# Patient Record
Sex: Male | Born: 1955 | Race: White | Hispanic: No | Marital: Married | Smoking: Never smoker
Health system: Southern US, Community
[De-identification: ages and names within clinical notes are randomized; demographics above are authoritative.]

## PROBLEM LIST (undated history)

## (undated) DIAGNOSIS — I480 Paroxysmal atrial fibrillation: Secondary | ICD-10-CM

## (undated) DIAGNOSIS — K219 Gastro-esophageal reflux disease without esophagitis: Secondary | ICD-10-CM

## (undated) DIAGNOSIS — E785 Hyperlipidemia, unspecified: Secondary | ICD-10-CM

## (undated) DIAGNOSIS — J45909 Unspecified asthma, uncomplicated: Secondary | ICD-10-CM

## (undated) DIAGNOSIS — J90 Pleural effusion, not elsewhere classified: Secondary | ICD-10-CM

## (undated) DIAGNOSIS — I10 Essential (primary) hypertension: Secondary | ICD-10-CM

## (undated) DIAGNOSIS — I319 Disease of pericardium, unspecified: Secondary | ICD-10-CM

## (undated) DIAGNOSIS — G4730 Sleep apnea, unspecified: Secondary | ICD-10-CM

---

## 2020-02-04 ENCOUNTER — Emergency Department (HOSPITAL_COMMUNITY): Payer: Medicare PPO

## 2020-02-04 ENCOUNTER — Inpatient Hospital Stay (HOSPITAL_COMMUNITY)
Admission: EM | Admit: 2020-02-04 | Discharge: 2020-02-07 | DRG: 281 | Disposition: A | Discharge status: Home / Self Care | Payer: Medicare PPO | Attending: Internal Medicine | Admitting: Internal Medicine

## 2020-02-04 ENCOUNTER — Encounter (HOSPITAL_COMMUNITY): Payer: Self-pay | Admitting: Emergency Medicine

## 2020-02-04 ENCOUNTER — Other Ambulatory Visit: Payer: Self-pay

## 2020-02-04 DIAGNOSIS — I214 Non-ST elevation (NSTEMI) myocardial infarction: Secondary | ICD-10-CM | POA: Diagnosis not present

## 2020-02-04 DIAGNOSIS — Z888 Allergy status to other drugs, medicaments and biological substances status: Secondary | ICD-10-CM

## 2020-02-04 DIAGNOSIS — I25118 Atherosclerotic heart disease of native coronary artery with other forms of angina pectoris: Secondary | ICD-10-CM

## 2020-02-04 DIAGNOSIS — K219 Gastro-esophageal reflux disease without esophagitis: Secondary | ICD-10-CM | POA: Diagnosis present

## 2020-02-04 DIAGNOSIS — G4733 Obstructive sleep apnea (adult) (pediatric): Secondary | ICD-10-CM

## 2020-02-04 DIAGNOSIS — I10 Essential (primary) hypertension: Secondary | ICD-10-CM | POA: Diagnosis present

## 2020-02-04 DIAGNOSIS — F32A Depression, unspecified: Secondary | ICD-10-CM | POA: Diagnosis present

## 2020-02-04 DIAGNOSIS — Z6841 Body Mass Index (BMI) 40.0 and over, adult: Secondary | ICD-10-CM

## 2020-02-04 DIAGNOSIS — J45909 Unspecified asthma, uncomplicated: Secondary | ICD-10-CM | POA: Diagnosis present

## 2020-02-04 DIAGNOSIS — Z87892 Personal history of anaphylaxis: Secondary | ICD-10-CM

## 2020-02-04 DIAGNOSIS — Z7982 Long term (current) use of aspirin: Secondary | ICD-10-CM

## 2020-02-04 DIAGNOSIS — I2510 Atherosclerotic heart disease of native coronary artery without angina pectoris: Secondary | ICD-10-CM | POA: Diagnosis present

## 2020-02-04 DIAGNOSIS — Z79899 Other long term (current) drug therapy: Secondary | ICD-10-CM

## 2020-02-04 DIAGNOSIS — G43909 Migraine, unspecified, not intractable, without status migrainosus: Secondary | ICD-10-CM | POA: Diagnosis present

## 2020-02-04 DIAGNOSIS — Z20822 Contact with and (suspected) exposure to covid-19: Secondary | ICD-10-CM | POA: Diagnosis present

## 2020-02-04 DIAGNOSIS — I480 Paroxysmal atrial fibrillation: Secondary | ICD-10-CM | POA: Diagnosis present

## 2020-02-04 DIAGNOSIS — F419 Anxiety disorder, unspecified: Secondary | ICD-10-CM | POA: Diagnosis present

## 2020-02-04 DIAGNOSIS — M109 Gout, unspecified: Secondary | ICD-10-CM | POA: Diagnosis present

## 2020-02-04 DIAGNOSIS — I4819 Other persistent atrial fibrillation: Secondary | ICD-10-CM | POA: Diagnosis present

## 2020-02-04 DIAGNOSIS — E782 Mixed hyperlipidemia: Secondary | ICD-10-CM | POA: Diagnosis present

## 2020-02-04 DIAGNOSIS — Z885 Allergy status to narcotic agent status: Secondary | ICD-10-CM

## 2020-02-04 DIAGNOSIS — R079 Chest pain, unspecified: Secondary | ICD-10-CM | POA: Diagnosis present

## 2020-02-04 HISTORY — DX: Pleural effusion, not elsewhere classified: J90

## 2020-02-04 HISTORY — DX: Gastro-esophageal reflux disease without esophagitis: K21.9

## 2020-02-04 HISTORY — DX: Unspecified asthma, uncomplicated: J45.909

## 2020-02-04 HISTORY — DX: Sleep apnea, unspecified: G47.30

## 2020-02-04 HISTORY — DX: Hyperlipidemia, unspecified: E78.5

## 2020-02-04 HISTORY — DX: Disease of pericardium, unspecified: I31.9

## 2020-02-04 HISTORY — DX: Paroxysmal atrial fibrillation: I48.0

## 2020-02-04 HISTORY — DX: Essential (primary) hypertension: I10

## 2020-02-04 LAB — CBC
HCT: 50.4 % (ref 39.0–52.0)
Hemoglobin: 17 g/dL (ref 13.0–17.0)
MCH: 32.4 pg (ref 26.0–34.0)
MCHC: 33.7 g/dL (ref 30.0–36.0)
MCV: 96.2 fL (ref 80.0–100.0)
Platelets: 185 10*3/uL (ref 150–400)
RBC: 5.24 MIL/uL (ref 4.22–5.81)
RDW: 12.8 % (ref 11.5–15.5)
WBC: 12.2 10*3/uL — ABNORMAL HIGH (ref 4.0–10.5)
nRBC: 0 % (ref 0.0–0.2)

## 2020-02-04 LAB — BASIC METABOLIC PANEL
Anion gap: 9 (ref 5–15)
BUN: 18 mg/dL (ref 8–23)
CO2: 25 mmol/L (ref 22–32)
Calcium: 9.1 mg/dL (ref 8.9–10.3)
Chloride: 103 mmol/L (ref 98–111)
Creatinine, Ser: 0.85 mg/dL (ref 0.61–1.24)
GFR, Estimated: >60 mL/min (ref >60)
Glucose, Bld: 122 mg/dL — ABNORMAL HIGH (ref 70–99)
Potassium: 4.2 mmol/L (ref 3.5–5.1)
Sodium: 137 mmol/L (ref 135–145)

## 2020-02-04 LAB — TROPONIN I (HIGH SENSITIVITY): Troponin I (High Sensitivity): 247 ng/L (ref <18)

## 2020-02-04 MED ORDER — NITROGLYCERIN 0.4 MG SL SUBL
0.4000 mg | SUBLINGUAL_TABLET | SUBLINGUAL | Status: DC | PRN
  Administered 2020-02-05 (×2): 0.4 mg via SUBLINGUAL
  Filled 2020-02-04: qty 1

## 2020-02-04 MED ORDER — HEPARIN (PORCINE) 25000 UT/250ML-% IV SOLN
1850.0000 [IU]/h | INTRAVENOUS | Status: DC
  Administered 2020-02-05: 1400 [IU]/h via INTRAVENOUS
  Administered 2020-02-05: 12:00:00 1700 [IU]/h via INTRAVENOUS
  Administered 2020-02-06: 01:00:00 1850 [IU]/h via INTRAVENOUS
  Filled 2020-02-04 (×3): qty 250

## 2020-02-04 MED ORDER — DIPHENHYDRAMINE HCL 50 MG/ML IJ SOLN
25.0000 mg | Freq: Once | INTRAMUSCULAR | Status: AC
  Administered 2020-02-04: 25 mg via INTRAVENOUS
  Filled 2020-02-04: qty 1

## 2020-02-04 MED ORDER — ASPIRIN 81 MG PO CHEW
324.0000 mg | CHEWABLE_TABLET | Freq: Once | ORAL | Status: AC
  Administered 2020-02-04: 23:00:00 324 mg via ORAL
  Filled 2020-02-04: qty 4

## 2020-02-04 MED ORDER — HEPARIN BOLUS VIA INFUSION
4000.0000 [IU] | Freq: Once | INTRAVENOUS | Status: AC
  Administered 2020-02-05: 4000 [IU] via INTRAVENOUS
  Filled 2020-02-04: qty 4000

## 2020-02-04 MED ORDER — PROCHLORPERAZINE EDISYLATE 10 MG/2ML IJ SOLN
10.0000 mg | Freq: Once | INTRAMUSCULAR | Status: AC
  Administered 2020-02-04: 10 mg via INTRAVENOUS
  Filled 2020-02-04: qty 2

## 2020-02-04 NOTE — ED Notes (Signed)
Date and time results received: 02/04/20 2035 (use smartphrase ".now" to insert current time)  Test: troponin Critical Value: 247  Name of Provider Notified: Dalene Seltzer, MD   Orders Received? Or Actions Taken?: Actions Taken: Provider notified

## 2020-02-04 NOTE — ED Triage Notes (Signed)
Patient c/o chest pain radiating to neck and back. Reports pain mimics previous symptoms with pericarditis.

## 2020-02-04 NOTE — ED Provider Notes (Addendum)
**Marcus Marcus** Marcus Marcus   CSN: [id number] Arrival date & time: 02/04/20  1927     History Chief Complaint  Patient presents with  . Chest Pain    Marcus Marcus Marcus Marcus. is a 64 y.o. male.  HPI      64yo male with history of atrial fibrillation no longer on eliquis/flecainide as of March 2021 given no known recurrence of atrial fibrillation, asthma, hypertension, hypercholesterolemia, pericardial effusion with pericarditis 2019, presents with concern for chest pain.   Pain located middle of the chest with radiation to the jaw bilaterally and back. Began Sunday AM, head throbbing, felt badly. Mainly just slept, fatigue.  One day last week had chest pain and fatigue, then improved and then Sunday it returned.  Associated dyspnea, can't fill lungs fast enough. Sleeps with CPAP and woke up short of breath even with CPAP on.  Has not been walking around the last few days due to feel uncomfortable and severe malaise so not sure if symptoms are exertional.  No nausea or vomiting.  No lightheadedness.  No fevers.  Mild cough dry.  No sore throat/runny nose/loss of taste or smell. No sick contacts.  No recent surgeries/long trips/pain or swelling in legs.  Pain is not positional, not pleuritic.  6/10 pain now, was 8/10. Coming and going since Sunday, constant all day today. Has not wanted to exert self or move around due to discomfort.  Dr. Tollie Pizza DO through [facility] is Cardiologist  Past Medical History:  Diagnosis Date  . Pericarditis     There are no problems to display for this patient.   History reviewed. No pertinent surgical history.     No family history on file.     Home Medications Prior to Admission medications   Medication Sig Start Date End Date Taking? Authorizing Provider  albuterol (VENTOLIN HFA) 108 (90 Base) MCG/ACT inhaler Inhale 2 puffs into the lungs every 4 (four) hours as needed for wheezing or shortness of breath.  05/27/17  Yes [provider]  allopurinol (ZYLOPRIM) 100 MG tablet Take 100 mg by mouth daily. 01/28/20  Yes [provider]  aspirin 81 MG EC tablet Take 81 mg by mouth daily.   Yes [provider]  atorvastatin (LIPITOR) 40 MG tablet Take 40 mg by mouth daily. 10/05/19  Yes [provider]  cetirizine (ZYRTEC) 10 MG tablet Take 10 mg by mouth at bedtime.   Yes [provider]  colchicine 0.6 MG tablet Take 0.6 mg by mouth 2 (two) times daily. 10/13/19  Yes [provider]  divalproex (DEPAKOTE) 250 MG DR tablet Take 250-750 mg by mouth See admin instructions. 250 MG (1 tab) in the morning and 750 MG (3 tabs) in the evening. 10/10/19  Yes [provider]  DULoxetine (CYMBALTA) 60 MG capsule Take 60 mg by mouth at bedtime. 01/21/20  Yes [provider]  esomeprazole (NEXIUM) 40 MG capsule Take 40 mg by mouth daily. 10/03/19  Yes [provider]  FLOVENT HFA 220 MCG/ACT inhaler Inhale 2 puffs into the lungs 2 (two) times daily. 10/05/19  Yes [provider]  ketotifen (ZADITOR) 0.025 % ophthalmic solution Place 1 drop into both eyes 2 (two) times daily.   Yes [provider]  losartan (COZAAR) 50 MG tablet Take 50 mg by mouth daily. 01/23/20  Yes [provider]  montelukast (SINGULAIR) 10 MG tablet Take 10 mg by mouth daily. 10/03/19  Yes [provider]  Multiple  Vitamins-Minerals (PRESERVISION AREDS 2 PO) Take 1 tablet by mouth in the morning and at bedtime.   Yes [provider]  propranolol (INDERAL) 40 MG tablet Take 40 mg by mouth 2 (two) times daily. 12/31/19  Yes [provider]  sertraline (ZOLOFT) 50 MG tablet Take 50 mg by mouth daily. 01/23/20  Yes [provider]  valACYclovir (VALTREX) 1000 MG tablet Take 1,000 mg by mouth daily. 10/18/19  Yes [provider]    Allergies    Benzonatate, Codeine, Hydrochlorothiazide, and Furosemide  Review  of Systems   Review of Systems  Constitutional: Positive for fatigue. Negative for fever.  HENT: Negative for sore throat.   Eyes: Negative for visual disturbance.  Respiratory: Positive for cough (dry) and shortness of breath.   Cardiovascular: Positive for chest pain.  Gastrointestinal: Negative for abdominal pain, nausea and vomiting.  Genitourinary: Negative for difficulty urinating.  Musculoskeletal: Positive for back pain. Negative for neck stiffness.  Skin: Negative for rash.  Neurological: Positive for headaches. Negative for syncope.    Physical Exam Updated Vital Signs BP (!) 154/101   Pulse (!) 112   Temp 98.4 F (36.9 C) (Oral)   Resp (!) 29   Ht 5\' 9"  (1.753 m)   Wt 127 kg   SpO2 95%   BMI 41.35 kg/m   Physical Exam Vitals and nursing Marcus reviewed.  Constitutional:      General: He is not in acute distress.    Appearance: He is well-developed and well-nourished. He is not diaphoretic.  HENT:     Head: Normocephalic and atraumatic.  Eyes:     Extraocular Movements: EOM normal.     Conjunctiva/sclera: Conjunctivae normal.  Cardiovascular:     Rate and Rhythm: Normal rate and regular rhythm.     Pulses: Intact distal pulses.     Heart sounds: Normal heart sounds. No murmur heard. No friction rub. No gallop.   Pulmonary:     Effort: Pulmonary effort is normal. No respiratory distress.     Breath sounds: Normal breath sounds. No wheezing or rales.  Abdominal:     General: There is no distension.     Palpations: Abdomen is soft.     Tenderness: There is no abdominal tenderness. There is no guarding.  Musculoskeletal:        General: No edema.     Cervical back: Normal range of motion.  Skin:    General: Skin is warm and dry.  Neurological:     Mental Status: He is alert and oriented to person, place, and time.     ED Results / Procedures / Treatments   Labs (all labs ordered are listed, but only abnormal results are displayed) Labs Reviewed   BASIC METABOLIC PANEL - Abnormal; Notable for the following components:      Result Value   Glucose, Bld 122 (*)    All other components within normal limits  CBC - Abnormal; Notable for the following components:   WBC 12.2 (*)    All other components within normal limits  TROPONIN I (HIGH SENSITIVITY) - Abnormal; Notable for the following components:   Troponin I (High Sensitivity) 247 (*)    All other components within normal limits  TROPONIN I (HIGH SENSITIVITY) - Abnormal; Notable for the following components:   Troponin I (High Sensitivity) 247 (*)    All other components within normal limits  RESP PANEL BY RT-PCR (FLU A&B, COVID) ARPGX2  CBC  SEDIMENTATION RATE  HEPARIN LEVEL (UNFRACTIONATED)  EKG EKG Interpretation  Date/Time:  Monday February 04 2020 19:37:12 EST Ventricular Rate:  90 PR Interval:    QRS Duration: 94 QT Interval:  349 QTC Calculation: 427 R Axis:   -44 Text Interpretation: Atrial fibrillation Abnormal R-wave progression, early transition Left ventricular hypertrophy Inferior infarct, old 12 Lead; Mason-Likar No previous ECGs available Confirmed by Alvira MondaySchlossman, Aarion Kittrell ([id number]) on 02/04/2020 8:34:28 PM   Radiology CT Head Wo Contrast  Result Date: 02/04/2020 CLINICAL DATA:  [age] year old male with headache. Concern for intracranial hemorrhage. EXAM: CT HEAD WITHOUT CONTRAST TECHNIQUE: Contiguous axial images were obtained from the base of the skull through the vertex without intravenous contrast. COMPARISON:  None. FINDINGS: Brain: The ventricles and sulci appropriate size for patient's age. The gray-white matter discrimination is preserved. There is no acute intracranial hemorrhage. No mass effect midline shift no extra-axial fluid collection. Vascular: No hyperdense vessel or unexpected calcification. Skull: Normal. Negative for fracture or focal lesion. Sinuses/Orbits: Mild mucoperiosteal thickening of paranasal sinuses. No air-fluid level. The mastoid air  cells are clear. Other: None IMPRESSION: Unremarkable noncontrast CT of the brain. Electronically Signed   By: Elgie CollardArash  Radparvar M.D.   On: 02/04/2020 23:27   DG Chest Portable 1 View  Result Date: 02/04/2020 CLINICAL DATA:  Chest pain, shortness of breath EXAM: PORTABLE CHEST 1 VIEW COMPARISON:  12/19/2017 FINDINGS: Single frontal view of the chest demonstrates an enlarged cardiac silhouette. No acute airspace disease, effusion, or pneumothorax. No acute bony abnormalities. IMPRESSION: 1. Enlarged cardiac silhouette.  No acute airspace disease. Electronically Signed   By: Sharlet SalinaMichael  Brown M.D.   On: 02/04/2020 21:25    Procedures .Critical Care Performed by: Alvira MondaySchlossman, Kareema Keitt, MD Authorized by: Alvira MondaySchlossman, Evee Liska, MD   Critical care provider statement:    Critical care time (minutes):  60   Critical care was time spent personally by me on the following activities:  Discussions with consultants, evaluation of patient's response to treatment, examination of patient, ordering and performing treatments and interventions, ordering and review of laboratory studies, ordering and review of radiographic studies, pulse oximetry, re-evaluation of patient's condition, obtaining history from patient or surrogate and review of old charts   (including critical care time)  Medications Ordered in ED Medications  nitroGLYCERIN (NITROSTAT) SL tablet 0.4 mg (has no administration in time range)  heparin bolus via infusion 4,000 Units (4,000 Units Intravenous Bolus from Bag 02/05/20 0004)    Followed by  heparin ADULT infusion 100 units/mL (25000 units/22450mL sodium chloride 0.45%) (1,400 Units/hr Intravenous New Bag/Given 02/05/20 0004)  aspirin chewable tablet 324 mg (324 mg Oral Given 02/04/20 2311)  prochlorperazine (COMPAZINE) injection 10 mg (10 mg Intravenous Given 02/04/20 2359)  diphenhydrAMINE (BENADRYL) injection 25 mg (25 mg Intravenous Given 02/04/20 2357)    ED Course  I have reviewed the triage  vital signs and the nursing notes.  Pertinent labs & imaging results that were available during my care of the patient were reviewed by me and considered in my medical decision making (see chart for details).    MDM Rules/Calculators/A&P                           64yo male with history of atrial fibrillation no longer on eliquis/flecainide as of March 2021 given no known recurrence of atrial fibrillation, asthma, hypertension, hypercholesterolemia, pericardial effusion with pericarditis 2019, presents with concern for chest pain.  Differential diagnosis for chest pain includes pulmonary embolus, dissection, pneumothorax, pneumonia, ACS, myocarditis, pericarditis.  EKG  was done and evaluate by me and showed no acute ST changes and no signs of pericarditis, does show atrial fibrillation. Chest x-ray was done and evaluated by me and radiology and showed no sign of pneumonia or pneumothorax.  Low suspicion for PE given no hypoxia, no asymmetric leg swelling, syncope.  Low suspicion for aortic dissection given days of symptoms, equal upper and lower extremity pulses, associated dyspnea, no abnormalities on CXR to suggest this.  Doubt pericarditis no positional pain, no vital sign or physical exam findings to suggest tamponade.  Troponin returned elevated to 247.  Suspect NSTEMI by history, troponin elevation.    On reevaluation reported worsening headache and given family hx of intracranial aneurysm and plan to initiate heparin with concern for NSTEMI, ordered CT Head. CT head without abnormalities, do not feel headache thunderclap/hx not concerning for [facility] and do not feel LP indicated.  Headache resolved with compazine/benadryl.  Initiated heparin gtt for NSTEMI. Repeat troponin also 247. Discussed with Dr. Mackie Pai of Cardiology.  There are currently no beds at [facility]. Will admit to hospitalist at [facility], continue heparin gtt, monitor troponin and Cardiology to see in AM.    Final Clinical Impression(s)  / ED Diagnoses Final diagnoses:  NSTEMI (non-ST elevated myocardial infarction) [facility])    Rx / DC Orders ED Discharge Orders    None         Alvira Monday, MD 02/05/20 0105

## 2020-02-04 NOTE — Progress Notes (Signed)
ANTICOAGULATION CONSULT NOTE - Initial Consult  Pharmacy Consult for Heparin Indication: chest pain/ACS  No Known Allergies  Patient Measurements: Height: 5\' 9"  (175.3 cm) Weight: 127 kg (280 lb) IBW/kg (Calculated) : 70.7 HEPARIN DW (KG): 100   Vital Signs: Temp: 98.4 F (36.9 C) (12/20 2028) Temp Source: Oral (12/20 2028) BP: 158/104 (12/20 2200) Pulse Rate: 86 (12/20 2200)  Labs: Recent Labs    02/04/20 1956  HGB 17.0  HCT 50.4  PLT 185  CREATININE 0.85  TROPONINIHS 247*    Estimated Creatinine Clearance: 114.2 mL/min (by C-G formula based on SCr of 0.85 mg/dL).   Medical History: Past Medical History:  Diagnosis Date  . Pericarditis     Medications:  Infusions:   Assessment: 64 yo M presents with chest pain.  Troponin elevated.  CBC WNL, no bleeding noted.   Previously on Eliquis + flecanide for Afib which he stopped in March 2021.    Goal of Therapy:  Heparin level 0.3-0.7 units/ml Monitor platelets by anticoagulation protocol: Yes   Plan:  Heparin 4000 units IV bolus followed by infusion at 1400 units/hr Check heparin level 6h after infusion starts Daily heparin level & CBC while on heparin Monitor closely for bleeding  April 2021 PharmD, BCPS 02/04/2020,11:38 PM

## 2020-02-04 NOTE — ED Notes (Signed)
Patient transported to CT 

## 2020-02-04 NOTE — ED Notes (Signed)
RN made aware of vitals  

## 2020-02-05 ENCOUNTER — Observation Stay (HOSPITAL_COMMUNITY): Payer: Medicare PPO

## 2020-02-05 ENCOUNTER — Encounter (HOSPITAL_COMMUNITY): Payer: Self-pay | Admitting: Internal Medicine

## 2020-02-05 DIAGNOSIS — K219 Gastro-esophageal reflux disease without esophagitis: Secondary | ICD-10-CM | POA: Diagnosis present

## 2020-02-05 DIAGNOSIS — Z888 Allergy status to other drugs, medicaments and biological substances status: Secondary | ICD-10-CM | POA: Diagnosis not present

## 2020-02-05 DIAGNOSIS — I214 Non-ST elevation (NSTEMI) myocardial infarction: Secondary | ICD-10-CM | POA: Diagnosis present

## 2020-02-05 DIAGNOSIS — I2510 Atherosclerotic heart disease of native coronary artery without angina pectoris: Secondary | ICD-10-CM | POA: Diagnosis present

## 2020-02-05 DIAGNOSIS — Z6841 Body Mass Index (BMI) 40.0 and over, adult: Secondary | ICD-10-CM | POA: Diagnosis not present

## 2020-02-05 DIAGNOSIS — Z87892 Personal history of anaphylaxis: Secondary | ICD-10-CM | POA: Diagnosis not present

## 2020-02-05 DIAGNOSIS — Z885 Allergy status to narcotic agent status: Secondary | ICD-10-CM | POA: Diagnosis not present

## 2020-02-05 DIAGNOSIS — R079 Chest pain, unspecified: Secondary | ICD-10-CM | POA: Diagnosis present

## 2020-02-05 DIAGNOSIS — F419 Anxiety disorder, unspecified: Secondary | ICD-10-CM | POA: Diagnosis present

## 2020-02-05 DIAGNOSIS — G43909 Migraine, unspecified, not intractable, without status migrainosus: Secondary | ICD-10-CM | POA: Diagnosis present

## 2020-02-05 DIAGNOSIS — E782 Mixed hyperlipidemia: Secondary | ICD-10-CM | POA: Diagnosis present

## 2020-02-05 DIAGNOSIS — I25118 Atherosclerotic heart disease of native coronary artery with other forms of angina pectoris: Secondary | ICD-10-CM | POA: Diagnosis not present

## 2020-02-05 DIAGNOSIS — J45909 Unspecified asthma, uncomplicated: Secondary | ICD-10-CM | POA: Diagnosis present

## 2020-02-05 DIAGNOSIS — I10 Essential (primary) hypertension: Secondary | ICD-10-CM | POA: Diagnosis present

## 2020-02-05 DIAGNOSIS — F32A Depression, unspecified: Secondary | ICD-10-CM | POA: Diagnosis present

## 2020-02-05 DIAGNOSIS — Z7982 Long term (current) use of aspirin: Secondary | ICD-10-CM | POA: Diagnosis not present

## 2020-02-05 DIAGNOSIS — G4733 Obstructive sleep apnea (adult) (pediatric): Secondary | ICD-10-CM

## 2020-02-05 DIAGNOSIS — I480 Paroxysmal atrial fibrillation: Secondary | ICD-10-CM | POA: Diagnosis present

## 2020-02-05 DIAGNOSIS — I313 Pericardial effusion (noninflammatory): Secondary | ICD-10-CM

## 2020-02-05 DIAGNOSIS — Z79899 Other long term (current) drug therapy: Secondary | ICD-10-CM | POA: Diagnosis not present

## 2020-02-05 DIAGNOSIS — M109 Gout, unspecified: Secondary | ICD-10-CM | POA: Diagnosis present

## 2020-02-05 DIAGNOSIS — Z20822 Contact with and (suspected) exposure to covid-19: Secondary | ICD-10-CM | POA: Diagnosis present

## 2020-02-05 DIAGNOSIS — I4819 Other persistent atrial fibrillation: Secondary | ICD-10-CM | POA: Diagnosis present

## 2020-02-05 LAB — CBC
HCT: 49.1 % (ref 39.0–52.0)
Hemoglobin: 16.7 g/dL (ref 13.0–17.0)
MCH: 32.4 pg (ref 26.0–34.0)
MCHC: 34 g/dL (ref 30.0–36.0)
MCV: 95.2 fL (ref 80.0–100.0)
Platelets: 177 10*3/uL (ref 150–400)
RBC: 5.16 MIL/uL (ref 4.22–5.81)
RDW: 13 % (ref 11.5–15.5)
WBC: 10.6 10*3/uL — ABNORMAL HIGH (ref 4.0–10.5)
nRBC: 0 % (ref 0.0–0.2)

## 2020-02-05 LAB — LIPID PANEL
Cholesterol: 131 mg/dL (ref 0–200)
HDL: 28 mg/dL — ABNORMAL LOW (ref >40)
LDL Cholesterol: 89 mg/dL (ref 0–99)
Total CHOL/HDL Ratio: 4.7 RATIO
Triglycerides: 69 mg/dL (ref <150)
VLDL: 14 mg/dL (ref 0–40)

## 2020-02-05 LAB — TROPONIN I (HIGH SENSITIVITY)
Troponin I (High Sensitivity): 247 ng/L (ref <18)
Troponin I (High Sensitivity): 263 ng/L (ref <18)
Troponin I (High Sensitivity): 133 ng/L (ref <18)
Troponin I (High Sensitivity): 133 ng/L (ref <18)

## 2020-02-05 LAB — ECHOCARDIOGRAM COMPLETE
Area-P 1/2: 6.17 cm2
Calc EF: 55.9 %
Height: 69 in
MV M vel: 5.58 m/s
MV Peak grad: 124.5 mmHg
Radius: 0.6 cm
S' Lateral: 3.1 cm
Single Plane A2C EF: 57.5 %
Single Plane A4C EF: 53.8 %
Weight: 4480 oz

## 2020-02-05 LAB — RESP PANEL BY RT-PCR (FLU A&B, COVID) ARPGX2
Influenza A by PCR: NEGATIVE
Influenza B by PCR: NEGATIVE
SARS Coronavirus 2 by RT PCR: NEGATIVE

## 2020-02-05 LAB — HEPARIN LEVEL (UNFRACTIONATED)
Heparin Unfractionated: 0.16 IU/mL — ABNORMAL LOW (ref 0.30–0.70)
Heparin Unfractionated: 0.27 IU/mL — ABNORMAL LOW (ref 0.30–0.70)

## 2020-02-05 LAB — HIV ANTIBODY (ROUTINE TESTING W REFLEX): HIV Screen 4th Generation wRfx: NONREACTIVE

## 2020-02-05 LAB — COMPREHENSIVE METABOLIC PANEL
ALT: 25 U/L (ref 0–44)
AST: 22 U/L (ref 15–41)
Albumin: 3.6 g/dL (ref 3.5–5.0)
Alkaline Phosphatase: 90 U/L (ref 38–126)
Anion gap: 12 (ref 5–15)
BUN: 17 mg/dL (ref 8–23)
CO2: 24 mmol/L (ref 22–32)
Calcium: 8.9 mg/dL (ref 8.9–10.3)
Chloride: 102 mmol/L (ref 98–111)
Creatinine, Ser: 0.78 mg/dL (ref 0.61–1.24)
GFR, Estimated: >60 mL/min (ref >60)
Glucose, Bld: 114 mg/dL — ABNORMAL HIGH (ref 70–99)
Potassium: 3.9 mmol/L (ref 3.5–5.1)
Sodium: 138 mmol/L (ref 135–145)
Total Bilirubin: 0.8 mg/dL (ref 0.3–1.2)
Total Protein: 6.7 g/dL (ref 6.5–8.1)

## 2020-02-05 LAB — MAGNESIUM: Magnesium: 2.1 mg/dL (ref 1.7–2.4)

## 2020-02-05 LAB — SEDIMENTATION RATE: Sed Rate: 3 mm/hr (ref 0–16)

## 2020-02-05 LAB — BRAIN NATRIURETIC PEPTIDE: B Natriuretic Peptide: 218.8 pg/mL — ABNORMAL HIGH (ref 0.0–100.0)

## 2020-02-05 LAB — TSH: TSH: 4.97 u[IU]/mL — ABNORMAL HIGH (ref 0.350–4.500)

## 2020-02-05 MED ORDER — IPRATROPIUM-ALBUTEROL 0.5-2.5 (3) MG/3ML IN SOLN: 3.0000 mL | RESPIRATORY_TRACT | Status: DC | PRN

## 2020-02-05 MED ORDER — HEPARIN BOLUS VIA INFUSION
2000.0000 [IU] | Freq: Once | INTRAVENOUS | Status: AC
  Administered 2020-02-05: 07:00:00 2000 [IU] via INTRAVENOUS
  Filled 2020-02-05: qty 2000

## 2020-02-05 MED ORDER — HEPARIN BOLUS VIA INFUSION
2000.0000 [IU] | Freq: Once | INTRAVENOUS | Status: AC
  Administered 2020-02-05: 19:00:00 2000 [IU] via INTRAVENOUS
  Filled 2020-02-05: qty 2000

## 2020-02-05 MED ORDER — DIVALPROEX SODIUM 250 MG PO DR TAB: 250.0000 mg | DELAYED_RELEASE_TABLET | ORAL | Status: DC

## 2020-02-05 MED ORDER — MORPHINE SULFATE (PF) 2 MG/ML IV SOLN
1.0000 mg | INTRAVENOUS | Status: DC | PRN
  Administered 2020-02-05 – 2020-02-07 (×3): 1 mg via INTRAVENOUS
  Filled 2020-02-05 (×3): qty 1

## 2020-02-05 MED ORDER — SERTRALINE HCL 50 MG PO TABS
50.0000 mg | ORAL_TABLET | Freq: Every day | ORAL | Status: DC
  Administered 2020-02-05 – 2020-02-06 (×2): 50 mg via ORAL
  Filled 2020-02-05 (×2): qty 1

## 2020-02-05 MED ORDER — VALACYCLOVIR HCL 500 MG PO TABS
1000.0000 mg | ORAL_TABLET | Freq: Every day | ORAL | Status: DC
  Administered 2020-02-05 – 2020-02-07 (×3): 1000 mg via ORAL
  Filled 2020-02-05 (×3): qty 2

## 2020-02-05 MED ORDER — ALLOPURINOL 100 MG PO TABS
100.0000 mg | ORAL_TABLET | Freq: Every day | ORAL | Status: DC
  Administered 2020-02-05 – 2020-02-07 (×3): 100 mg via ORAL
  Filled 2020-02-05 (×4): qty 1

## 2020-02-05 MED ORDER — ATORVASTATIN CALCIUM 40 MG PO TABS
40.0000 mg | ORAL_TABLET | Freq: Every evening | ORAL | Status: DC
  Administered 2020-02-05 – 2020-02-07 (×3): 40 mg via ORAL
  Filled 2020-02-05 (×3): qty 1

## 2020-02-05 MED ORDER — DIVALPROEX SODIUM 250 MG PO DR TAB
250.0000 mg | DELAYED_RELEASE_TABLET | Freq: Every day | ORAL | Status: DC
  Administered 2020-02-05 – 2020-02-07 (×3): 250 mg via ORAL
  Filled 2020-02-05 (×3): qty 1

## 2020-02-05 MED ORDER — IOHEXOL 350 MG/ML SOLN
100.0000 mL | Freq: Once | INTRAVENOUS | Status: AC | PRN
  Administered 2020-02-05: 05:00:00 100 mL via INTRAVENOUS

## 2020-02-05 MED ORDER — ACETAMINOPHEN 325 MG PO TABS
650.0000 mg | ORAL_TABLET | ORAL | Status: DC | PRN
  Administered 2020-02-07: 650 mg via ORAL
  Filled 2020-02-05 (×2): qty 2

## 2020-02-05 MED ORDER — FLUTICASONE PROPIONATE HFA 220 MCG/ACT IN AERO
2.0000 | INHALATION_SPRAY | Freq: Two times a day (BID) | RESPIRATORY_TRACT | Status: DC
  Administered 2020-02-05 – 2020-02-07 (×5): 2 via RESPIRATORY_TRACT
  Filled 2020-02-05 (×3): qty 12

## 2020-02-05 MED ORDER — COLCHICINE 0.6 MG PO TABS
0.6000 mg | ORAL_TABLET | Freq: Two times a day (BID) | ORAL | Status: DC
  Administered 2020-02-05 – 2020-02-07 (×5): 0.6 mg via ORAL
  Filled 2020-02-05 (×5): qty 1

## 2020-02-05 MED ORDER — LORATADINE 10 MG PO TABS
10.0000 mg | ORAL_TABLET | Freq: Every day | ORAL | Status: DC
  Administered 2020-02-05 – 2020-02-06 (×2): 10 mg via ORAL
  Filled 2020-02-05 (×2): qty 1

## 2020-02-05 MED ORDER — PANTOPRAZOLE SODIUM 40 MG PO TBEC
40.0000 mg | DELAYED_RELEASE_TABLET | Freq: Every day | ORAL | Status: DC
  Administered 2020-02-05 – 2020-02-07 (×3): 40 mg via ORAL
  Filled 2020-02-05 (×3): qty 1

## 2020-02-05 MED ORDER — MONTELUKAST SODIUM 10 MG PO TABS
10.0000 mg | ORAL_TABLET | Freq: Every day | ORAL | Status: DC
  Administered 2020-02-05 – 2020-02-06 (×2): 10 mg via ORAL
  Filled 2020-02-05 (×2): qty 1

## 2020-02-05 MED ORDER — DULOXETINE HCL 60 MG PO CPEP
60.0000 mg | ORAL_CAPSULE | Freq: Every day | ORAL | Status: DC
  Administered 2020-02-05 – 2020-02-06 (×2): 60 mg via ORAL
  Filled 2020-02-05 (×2): qty 1

## 2020-02-05 MED ORDER — ALBUTEROL SULFATE HFA 108 (90 BASE) MCG/ACT IN AERS
2.0000 | INHALATION_SPRAY | RESPIRATORY_TRACT | Status: DC | PRN
  Filled 2020-02-05: qty 6.7

## 2020-02-05 MED ORDER — ONDANSETRON HCL 4 MG/2ML IJ SOLN: 4.0000 mg | Freq: Four times a day (QID) | INTRAMUSCULAR | Status: DC | PRN

## 2020-02-05 MED ORDER — SODIUM CHLORIDE 0.9 % IV SOLN: INTRAVENOUS | Status: AC

## 2020-02-05 MED ORDER — LOSARTAN POTASSIUM 50 MG PO TABS
50.0000 mg | ORAL_TABLET | Freq: Every day | ORAL | Status: DC
  Administered 2020-02-05: 21:00:00 50 mg via ORAL
  Filled 2020-02-05: qty 1

## 2020-02-05 MED ORDER — DIVALPROEX SODIUM 250 MG PO DR TAB
750.0000 mg | DELAYED_RELEASE_TABLET | Freq: Every day | ORAL | Status: DC
  Administered 2020-02-05 – 2020-02-06 (×2): 750 mg via ORAL
  Filled 2020-02-05 (×3): qty 3

## 2020-02-05 MED ORDER — PROPRANOLOL HCL 20 MG PO TABS
40.0000 mg | ORAL_TABLET | Freq: Two times a day (BID) | ORAL | Status: DC
  Administered 2020-02-05 (×2): 40 mg via ORAL
  Filled 2020-02-05 (×2): qty 2

## 2020-02-05 MED ORDER — KETOTIFEN FUMARATE 0.025 % OP SOLN
1.0000 [drp] | Freq: Two times a day (BID) | OPHTHALMIC | Status: DC
  Administered 2020-02-05 – 2020-02-07 (×5): 1 [drp] via OPHTHALMIC
  Filled 2020-02-05 (×3): qty 5

## 2020-02-05 MED ORDER — ASPIRIN EC 81 MG PO TBEC
81.0000 mg | DELAYED_RELEASE_TABLET | Freq: Every day | ORAL | Status: DC
  Administered 2020-02-05 – 2020-02-07 (×3): 81 mg via ORAL
  Filled 2020-02-05 (×3): qty 1

## 2020-02-05 MED ORDER — HYDRALAZINE HCL 20 MG/ML IJ SOLN
10.0000 mg | Freq: Four times a day (QID) | INTRAMUSCULAR | Status: DC | PRN
  Administered 2020-02-05 – 2020-02-07 (×3): 10 mg via INTRAVENOUS
  Filled 2020-02-05 (×3): qty 1

## 2020-02-05 NOTE — Plan of Care (Signed)

## 2020-02-05 NOTE — Progress Notes (Signed)
Chaplain rec'd referral from nurse. Patient welcomed chaplain for a visit.  His wife was bedside.  He shared some of his family story, including raising their grandson, Enis Slipper, who lives with them.  Their son is also active in grandson's care, but grandson chose to live with patient & wife. Pt spoke of encountering his mortality with recent health crisis and said he was a man of faith, he had no fear of dying, but "I have a lot more I want to do.  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  His faith is very important to him. Chaplain offered ministry of presence and prayer. Rev. Lynnell Chad Pager [phone]

## 2020-02-05 NOTE — Progress Notes (Signed)
Pharmacy - IV heparin  Assessment:    Please see note from [facility]) Doran Durand, PharmD earlier today for full details.  Briefly, 64 y.o. male on IV heparin for ACS   Most recent heparin level slightly low at 0.27 units/ml on 1700 units/hr  No bleeding or infusion issues per RN  Plan:   Increase heparin to 1850 units/hr  Recheck heparin level in 8 hr  Bernadene Person, PharmD, BCPS [phone] 02/05/2020, 2:28 PM

## 2020-02-05 NOTE — H&P (Signed)
History and Physical    Marcus Leonard. WGY:[id number] DOB: 02-Sep-1955 DOA: 02/04/2020  PCP: Houston Siren., MD  Patient coming from: Home   Chief Complaint:  Chief Complaint  Patient presents with  . Chest Pain     HPI:    64 year old male with past medical history of pericarditis with pericardial effusion (2019), paroxysmal atrial fibrillation (S/P cardioversion 05/2017), gout, hyperlipidemia, depression, migraine headaches, obstructive sleep apnea (on cpap), gastroesophageal reflux disease who presents to the long hospital emergency department with complaints of chest pain.  Patient explains that on Sunday morning he was sitting on his couch when he began to experience chest discomfort.  Patient describes this chest discomfort as located all across his anterior chest.  He describes it as "squeezing" in quality and moderate to severe in intensity.  Patient states that the discomfort radiates to his neck and is associated with shortness of breath.  Patient describes the shortness of breath as worse with exertion and improved with rest.  Upon further questioning patient denies sick contacts, fevers, nausea, vomiting, diarrhea, leg swelling or confirmed contact with COVID-19 infection.  Patient states that he is compliant with all his medications.  His chest discomfort and shortness of breath continued to persist overnight to the following day without relief.  As patient's symptoms continued to persist he began to develop associated generalized weakness and fatigue as well as poor appetite.  Patient eventually presented to [facility] emergency department with symptoms of ongoing chest discomfort fatigue and shortness of breath.  Upon evaluation in the emergency department, patient reports that his chest discomfort resolved shortly after arrival but is on clear as to what caused it to resolve.  Serial troponins were noted to be 247 followed by 247.  EKG revealed  controlled atrial fibrillation, rhythm the patient has not been in since April 2019.  Because of patient's concerning chest discomfort and elevated troponin emergency room provider discussed the case with the overnight cardiology fellow.  It was recommend that the patient be initiated on intravenous heparin infusion and admitted at [facility] under the hospital service with a need for a formal cardiology consultation in the morning.  Group was then called to assess the patient for admission to the hospital.  Review of Systems:   Review of Systems  Constitutional: Positive for malaise/fatigue.  Respiratory: Positive for shortness of breath.   Cardiovascular: Positive for chest pain.  Neurological: Positive for weakness.  All other systems reviewed and are negative.   Past Medical History:  Diagnosis Date  . Pericarditis     History reviewed. No pertinent surgical history.   reports that he has never smoked. He has never used smokeless tobacco. He reports that he does not drink alcohol and does not use drugs.  Allergies  Allergen Reactions  . Benzonatate Shortness Of Breath  . Codeine     Other reaction(s): GI Upset (intolerance), Other (See Comments) Over sedates me Over sedates me Constipation    . Hydrochlorothiazide Anaphylaxis, Photosensitivity and Hives    Stomach issues    . Furosemide     Other reaction(s): Red Man Syndrome (ALLERGY)    Family History  Problem Relation Age of Onset  . Aneurysm Father      Prior to Admission medications   Medication Sig Start Date End Date Taking? Authorizing Provider  albuterol (VENTOLIN HFA) 108 (90 Base) MCG/ACT inhaler Inhale 2 puffs into the lungs every 4 (four) hours as needed for wheezing or shortness of breath.  05/27/17  Yes [provider]  allopurinol (ZYLOPRIM) 100 MG tablet Take 100 mg by mouth daily. 01/28/20  Yes [provider]  aspirin 81 MG EC tablet Take 81 mg by mouth daily.   Yes [provider]  atorvastatin (LIPITOR) 40 MG tablet Take 40 mg by mouth daily. 10/05/19  Yes [provider]  cetirizine (ZYRTEC) 10 MG tablet Take 10 mg by mouth at bedtime.   Yes [provider]  colchicine 0.6 MG tablet Take 0.6 mg by mouth 2 (two) times daily. 10/13/19  Yes [provider]  divalproex (DEPAKOTE) 250 MG DR tablet Take 250-750 mg by mouth See admin instructions. 250 MG (1 tab) in the morning and 750 MG (3 tabs) in the evening. 10/10/19  Yes [provider]  DULoxetine (CYMBALTA) 60 MG capsule Take 60 mg by mouth at bedtime. 01/21/20  Yes [provider]  esomeprazole (NEXIUM) 40 MG capsule Take 40 mg by mouth daily. 10/03/19  Yes [provider]  FLOVENT HFA 220 MCG/ACT inhaler Inhale 2 puffs into the lungs 2 (two) times daily. 10/05/19  Yes [provider]  ketotifen (ZADITOR) 0.025 % ophthalmic solution Place 1 drop into both eyes 2 (two) times daily.   Yes [provider]  losartan (COZAAR) 50 MG tablet Take 50 mg by mouth daily. 01/23/20  Yes [provider]  montelukast (SINGULAIR) 10 MG tablet Take 10 mg by mouth daily. 10/03/19  Yes [provider]  Multiple Vitamins-Minerals (PRESERVISION AREDS 2 PO) Take 1 tablet by mouth in the morning and at bedtime.   Yes [provider]  propranolol (INDERAL) 40 MG tablet Take 40 mg by mouth 2 (two) times daily. 12/31/19  Yes [provider]  sertraline (ZOLOFT) 50 MG tablet Take 50 mg by mouth daily. 01/23/20  Yes [provider]  valACYclovir (VALTREX) 1000 MG tablet Take 1,000 mg by mouth daily. 10/18/19  Yes [provider]    Physical Exam: Vitals:   02/05/20 0230 02/05/20 0300 02/05/20 0330 02/05/20 0400  BP: (!) 159/119 (!) 161/128 (!) 142/96 (!) 136/94  Pulse: 83 85 92 99  Resp: (!) 29 19 (!) 31 (!) 29  Temp:      TempSrc:      SpO2: 95% 91% 96% 90%  Weight:      Height:         Constitutional:  Patient is lethargic but arousable and oriented x3, no associated distress.   Skin: no rashes, no lesions, good skin turgor noted. Eyes: Pupils are equally reactive to light.  No evidence of scleral icterus or conjunctival pallor.  ENMT: Moist mucous membranes noted.  Posterior pharynx clear of any exudate or lesions.   Neck: normal, supple, no masses, no thyromegaly.  No evidence of jugular venous distension.   Respiratory: Notable faint bibasilar rales without any evidence of wheezing.  Patient is somewhat tachypneic without evidence of accessory muscle use.   Cardiovascular: Irregularly irregular rhythm with controlled rate, no murmurs / rubs / gallops. No extremity edema. 2+ pedal pulses. No carotid bruits.  Chest:   Nontender without crepitus or deformity.   Back:   Nontender without crepitus or deformity. Abdomen: Abdomen is soft and nontender.  No evidence of intra-abdominal masses.  Positive bowel sounds noted in all quadrants.   Musculoskeletal: No joint deformity upper and lower extremities. Good ROM, no contractures. Normal muscle tone.  Neurologic: CN 2-12 grossly intact. Sensation intact.  Patient moving all 4 extremities spontaneously.  Patient is following all commands.  Patient is responsive to verbal stimuli.   Psychiatric: Patient exhibits normal mood with somewhat flat affect.  Patient seems to possess insight as to their current situation.     Labs on Admission: I have personally reviewed following labs and imaging studies -   CBC: Recent Labs  Lab 02/04/20 1956  WBC 12.2*  HGB 17.0  HCT 50.4  MCV 96.2  PLT 086   Basic Metabolic Panel: Recent Labs  Lab 02/04/20 1956  NA 137  K 4.2  CL 103  CO2 25  GLUCOSE 122*  BUN 18  CREATININE 0.85  CALCIUM 9.1   GFR: Estimated Creatinine Clearance: 114.2 mL/min (by C-G formula based on SCr of 0.85 mg/dL). Liver Function Tests: No results for input(s): AST, ALT, ALKPHOS, BILITOT, PROT, ALBUMIN in the last 168  hours. No results for input(s): LIPASE, AMYLASE in the last 168 hours. No results for input(s): AMMONIA in the last 168 hours. Coagulation Profile: No results for input(s): INR, PROTIME in the last 168 hours. Cardiac Enzymes: No results for input(s): CKTOTAL, CKMB, CKMBINDEX, TROPONINI in the last 168 hours. BNP (last 3 results) No results for input(s): PROBNP in the last 8760 hours. HbA1C: No results for input(s): HGBA1C in the last 72 hours. CBG: No results for input(s): GLUCAP in the last 168 hours. Lipid Profile: No results for input(s): CHOL, HDL, LDLCALC, TRIG, CHOLHDL, LDLDIRECT in the last 72 hours. Thyroid Function Tests: No results for input(s): TSH, T4TOTAL, FREET4, T3FREE, THYROIDAB in the last 72 hours. Anemia Panel: No results for input(s): VITAMINB12, FOLATE, FERRITIN, TIBC, IRON, RETICCTPCT in the last 72 hours. Urine analysis: No results found for: COLORURINE, APPEARANCEUR, LABSPEC, PHURINE, GLUCOSEU, HGBUR, BILIRUBINUR, KETONESUR, PROTEINUR, UROBILINOGEN, NITRITE, LEUKOCYTESUR  Radiological Exams on Admission - Personally Reviewed: CT Head Wo Contrast  Result Date: 02/04/2020 CLINICAL DATA:  64 year old male with headache. Concern for intracranial hemorrhage. EXAM: CT HEAD WITHOUT CONTRAST TECHNIQUE: Contiguous axial images were obtained from the base of the skull through the vertex without intravenous contrast. COMPARISON:  None. FINDINGS: Brain: The ventricles and sulci appropriate size for patient's age. The gray-white matter discrimination is preserved. There is no acute intracranial hemorrhage. No mass effect midline shift no extra-axial fluid collection. Vascular: No hyperdense vessel or unexpected calcification. Skull: Normal. Negative for fracture or focal lesion. Sinuses/Orbits: Mild mucoperiosteal thickening of paranasal sinuses. No air-fluid level. The mastoid air cells are clear. Other: None IMPRESSION: Unremarkable noncontrast CT of the brain. Electronically  Signed   By: Anner Crete M.D.   On: 02/04/2020 23:27   DG Chest Portable 1 View  Result Date: 02/04/2020 CLINICAL DATA:  Chest pain, shortness of breath EXAM: PORTABLE CHEST 1 VIEW COMPARISON:  12/19/2017 FINDINGS: Single frontal view of the chest demonstrates an enlarged cardiac silhouette. No acute airspace disease, effusion, or pneumothorax. No acute bony abnormalities. IMPRESSION: 1. Enlarged cardiac silhouette.  No acute airspace disease. Electronically Signed   By: Randa Ngo M.D.   On: 02/04/2020 21:25    EKG: Personally reviewed.  Rhythm is fibrillation with heart rate of 90 bpm.  Evidence of left ventricular hypertrophy.  No dynamic ST segment changes appreciated.  Assessment/Plan Principal Problem:   Chest pain   Patient presenting with chest discomfort with both typical and atypical features  Patient reports never having had a noninvasive ischemic assessment or cardiac catheterization  While patient exhibits a somewhat elevated troponin, second troponin reveals a flat trajectory  Per overnight cardiology recommendations to the emergency department  provider, will continue heparin infusion  Continuing to cycle cardiac enzymes  Obtaining CT angiogram of the chest to rule out pulmonary embolism as a potential cause.    Obtaining ESR and echocardiogram to evaluate for pericarditis/pericardial effusion.  Monitoring patient on telemetry  Formal cardiology consultation in the morning.  Keeping patient n.p.o. in the meantime.  Active Problems:   AF (paroxysmal atrial fibrillation) [facility])   Patient presenting with controlled atrial fibrillation on arrival to the emergency department and continues to be in atrial fibrillation upon my evaluation of him  According to review of patient's outpatient cardiology notes, patient has not been in atrial fibrillation since he was cardioverted in 05/2017.  Atrial fibrillation was initially noted at the time of diagnosis of  patient's pericarditis and pericardial effusion, bringing up concern for recurrent pericarditis.  In the meantime, since patient is rate controlled continuing home regimen of propanolol  Patient is already on a heparin infusion for anticoagulation per cardiology recommendations due to elevated troponin  Monitoring patient on telemetry  Evaluating for pulmonary embolism with CT angiogram of the chest  Obtaining TSH  Obtaining echocardiogram    Mixed hyperlipidemia   Continue home regimen of statin therapy  Obtaining fasting lipid panel in the morning    OSA on CPAP   Continue home regimen of CPAP nightly    Essential hypertension   Continue home regimen of antihypertensive therapy    GERD without esophagitis    Continue home regimen of daily PPI   Code Status:  Full code Family Communication: deferred   Status is: Observation  The patient remains OBS appropriate and will d/c before 2 midnights.  Dispo: The patient is from: Home              Anticipated d/c is to: Home              Anticipated d/c date is: 2 days              Patient currently is not medically stable to d/c.        Vernelle Emerald MD [facility] Pager [phone]  If 7PM-7AM, please contact night-coverage [email or web address] Use universal [place] password for that web site. If you do not have the password, please call the hospital operator.  02/05/2020, 4:53 AM

## 2020-02-05 NOTE — ED Notes (Addendum)
Report called to 1401 RN [facility]

## 2020-02-05 NOTE — Progress Notes (Addendum)
HPI: 64 year old male with past medical history of pericarditis with pericardial effusion (2019), paroxysmal atrial fibrillation (S/P cardioversion 05/2017), gout, hyperlipidemia, depression, migraine headaches, obstructive sleep apnea (on cpap), gastroesophageal reflux disease who presents to the long hospital emergency department with complaints of chest pain.  Patient explains that on Sunday morning he was sitting on his couch when he began to experience chest discomfort.  Patient describes this chest discomfort as located all across his anterior chest.  He describes it as "squeezing" in quality and moderate to severe in intensity.  Patient states that the discomfort radiates to his neck and is associated with shortness of breath.  Patient describes the shortness of breath as worse with exertion and improved with rest.  Upon further questioning patient denies sick contacts, fevers, nausea, vomiting, diarrhea, leg swelling or confirmed contact with COVID-19 infection.  Patient states that he is compliant with all his medications.  His chest discomfort and shortness of breath continued to persist overnight to the following day without relief.  As patient's symptoms continued to persist he began to develop associated generalized weakness and fatigue as well as poor appetite.  Patient eventually presented to [facility] emergency department with symptoms of ongoing chest discomfort fatigue and shortness of breath.  Upon evaluation in the emergency department, patient reports that his chest discomfort resolved shortly after arrival but is on clear as to what caused it to resolve.  Serial troponins were noted to be 247 followed by 247.  EKG revealed controlled atrial fibrillation, rhythm the patient has not been in since April 2019.  Because of patient's concerning chest discomfort and elevated troponin emergency room provider discussed the case with the overnight cardiology fellow.  It was  recommend that the patient be initiated on intravenous heparin infusion and admitted at [facility] under the hospital service with a need for a formal cardiology consultation in the morning.  Group was then called to assess the patient for admission to the hospital.  02/05/20:  Seen and examined.  Reports chest pain, centrally.  Up trending Trop peaked at 263.  On heparin drip.  Cardiology consulted.  2D echo done on 02/05/2020 showed LVEF 60 to 65% with no regional wall motion abnormalities.  There is moderate left ventricular hypertrophy.  No significant pericardial effusion.  Please refer to H&P dictated by my partner Dr. Leafy Half on 02/05/2020 for further details of the assessment and plan.  Addendum:  Patient is being transferred to [facility] at cardiology's request.  NPO after midnight.  Started NS at 50 cc/hr, presumptively, pre-cath (Not confirmed).  We appreciate cardiology's assistance.

## 2020-02-05 NOTE — Progress Notes (Signed)
ANTICOAGULATION CONSULT NOTE   Pharmacy Consult for Heparin Indication: chest pain/ACS  Allergies  Allergen Reactions  . Benzonatate Shortness Of Breath  . Codeine     Other reaction(s): GI Upset (intolerance), Other (See Comments) Over sedates me Over sedates me Constipation    . Hydrochlorothiazide Anaphylaxis, Photosensitivity and Hives    Stomach issues    . Furosemide     Other reaction(s): Red Man Syndrome (ALLERGY)    Patient Measurements: Height: 5\' 9"  (175.3 cm) Weight: 127 kg (280 lb) IBW/kg (Calculated) : 70.7 HEPARIN DW (KG): 100   Vital Signs: Temp: 98.4 F (36.9 C) (12/20 2028) Temp Source: Oral (12/20 2028) BP: 152/117 (12/21 0630) Pulse Rate: 88 (12/21 0630)  Labs: Recent Labs    02/04/20 1956 02/04/20 2254 02/05/20 0449 02/05/20 0532  HGB 17.0  --  16.7  --   HCT 50.4  --  49.1  --   PLT 185  --  177  --   HEPARINUNFRC  --   --   --  0.16*  CREATININE 0.85  --   --   --   TROPONINIHS 247* 247*  --   --     Estimated Creatinine Clearance: 114.2 mL/min (by C-G formula based on SCr of 0.85 mg/dL).   Medical History: Past Medical History:  Diagnosis Date  . Pericarditis     Medications:  Infusions:  . heparin 1,400 Units/hr (02/05/20 0004)   Assessment: 64 yo M presents with chest pain.  Troponin elevated.  CBC WNL, no bleeding noted.   Previously on Eliquis + flecanide for Afib which he stopped in March 2021.   Chest CT negative for PE  02/05/2020:  Initial heparin level 0.16- subtherapeutic on heparin infusion at 1400 units/hr  CBC- WNL  No bleeding or infusion related issues per RN  Goal of Therapy:  Heparin level 0.3-0.7 units/ml Monitor platelets by anticoagulation protocol: Yes   Plan:  Re-bolus Heparin 2000 units IV then increase infusion to 1700 units/hr Check heparin level 6h after rate change Daily heparin level & CBC while on heparin Monitor closely for bleeding  02/07/2020 PharmD,  BCPS 02/05/2020,6:36 AM

## 2020-02-05 NOTE — Progress Notes (Signed)
°  Echocardiogram 2D Echocardiogram has been performed.  Marcus Leonard 02/05/2020, 10:57 AM

## 2020-02-06 ENCOUNTER — Encounter (HOSPITAL_COMMUNITY)
Admission: EM | Disposition: A | Discharge status: Home / Self Care | Payer: Self-pay | Source: Home / Self Care | Attending: Internal Medicine

## 2020-02-06 DIAGNOSIS — I214 Non-ST elevation (NSTEMI) myocardial infarction: Principal | ICD-10-CM

## 2020-02-06 DIAGNOSIS — I10 Essential (primary) hypertension: Secondary | ICD-10-CM

## 2020-02-06 DIAGNOSIS — E782 Mixed hyperlipidemia: Secondary | ICD-10-CM

## 2020-02-06 DIAGNOSIS — Z9989 Dependence on other enabling machines and devices: Secondary | ICD-10-CM

## 2020-02-06 DIAGNOSIS — I2510 Atherosclerotic heart disease of native coronary artery without angina pectoris: Secondary | ICD-10-CM

## 2020-02-06 DIAGNOSIS — R079 Chest pain, unspecified: Secondary | ICD-10-CM

## 2020-02-06 DIAGNOSIS — G4733 Obstructive sleep apnea (adult) (pediatric): Secondary | ICD-10-CM

## 2020-02-06 DIAGNOSIS — I480 Paroxysmal atrial fibrillation: Secondary | ICD-10-CM

## 2020-02-06 HISTORY — PX: LEFT HEART CATH AND CORONARY ANGIOGRAPHY: CATH118249

## 2020-02-06 LAB — CBC
HCT: 51.1 % (ref 39.0–52.0)
Hemoglobin: 17.6 g/dL — ABNORMAL HIGH (ref 13.0–17.0)
MCH: 32.5 pg (ref 26.0–34.0)
MCHC: 34.4 g/dL (ref 30.0–36.0)
MCV: 94.3 fL (ref 80.0–100.0)
Platelets: 211 10*3/uL (ref 150–400)
RBC: 5.42 MIL/uL (ref 4.22–5.81)
RDW: 12.8 % (ref 11.5–15.5)
WBC: 10.5 10*3/uL (ref 4.0–10.5)
nRBC: 0 % (ref 0.0–0.2)

## 2020-02-06 LAB — BASIC METABOLIC PANEL
Anion gap: 12 (ref 5–15)
BUN: 18 mg/dL (ref 8–23)
CO2: 20 mmol/L — ABNORMAL LOW (ref 22–32)
Calcium: 8.8 mg/dL — ABNORMAL LOW (ref 8.9–10.3)
Chloride: 101 mmol/L (ref 98–111)
Creatinine, Ser: 0.81 mg/dL (ref 0.61–1.24)
GFR, Estimated: >60 mL/min (ref >60)
Glucose, Bld: 145 mg/dL — ABNORMAL HIGH (ref 70–99)
Potassium: 3.6 mmol/L (ref 3.5–5.1)
Sodium: 133 mmol/L — ABNORMAL LOW (ref 135–145)

## 2020-02-06 LAB — HEPARIN LEVEL (UNFRACTIONATED): Heparin Unfractionated: 0.47 IU/mL (ref 0.30–0.70)

## 2020-02-06 LAB — MAGNESIUM: Magnesium: 2.1 mg/dL (ref 1.7–2.4)

## 2020-02-06 SURGERY — LEFT HEART CATH AND CORONARY ANGIOGRAPHY
Anesthesia: LOCAL

## 2020-02-06 MED ORDER — LABETALOL HCL 5 MG/ML IV SOLN: 10.0000 mg | INTRAVENOUS | Status: DC | PRN

## 2020-02-06 MED ORDER — SODIUM CHLORIDE 0.9% FLUSH: 3.0000 mL | INTRAVENOUS | Status: DC | PRN

## 2020-02-06 MED ORDER — FENTANYL CITRATE (PF) 100 MCG/2ML IJ SOLN
INTRAMUSCULAR | Status: AC
  Filled 2020-02-06: qty 2

## 2020-02-06 MED ORDER — LIDOCAINE HCL (PF) 1 % IJ SOLN
INTRAMUSCULAR | Status: AC
  Filled 2020-02-06: qty 30

## 2020-02-06 MED ORDER — LIDOCAINE HCL (PF) 1 % IJ SOLN
INTRAMUSCULAR | Status: DC | PRN
  Administered 2020-02-06: 5 mL via SUBCUTANEOUS

## 2020-02-06 MED ORDER — CARVEDILOL 6.25 MG PO TABS
6.2500 mg | ORAL_TABLET | Freq: Two times a day (BID) | ORAL | Status: DC
  Administered 2020-02-06 – 2020-02-07 (×2): 6.25 mg via ORAL
  Filled 2020-02-06 (×3): qty 1

## 2020-02-06 MED ORDER — VERAPAMIL HCL 2.5 MG/ML IV SOLN
INTRAVENOUS | Status: AC
  Filled 2020-02-06: qty 2

## 2020-02-06 MED ORDER — SODIUM CHLORIDE 0.9 % IV SOLN: 250.0000 mL | INTRAVENOUS | Status: DC | PRN

## 2020-02-06 MED ORDER — SODIUM CHLORIDE 0.9% FLUSH
3.0000 mL | Freq: Two times a day (BID) | INTRAVENOUS | Status: DC
  Administered 2020-02-06: 21:00:00 3 mL via INTRAVENOUS

## 2020-02-06 MED ORDER — HEPARIN (PORCINE) 25000 UT/250ML-% IV SOLN
1950.0000 [IU]/h | INTRAVENOUS | Status: DC
  Administered 2020-02-06: 22:00:00 1850 [IU]/h via INTRAVENOUS
  Filled 2020-02-06: qty 250

## 2020-02-06 MED ORDER — HEPARIN (PORCINE) IN NACL 1000-0.9 UT/500ML-% IV SOLN
INTRAVENOUS | Status: AC
  Filled 2020-02-06: qty 1000

## 2020-02-06 MED ORDER — VERAPAMIL HCL 2.5 MG/ML IV SOLN
INTRAVENOUS | Status: DC | PRN
  Administered 2020-02-06: 13:00:00 10 mL via INTRA_ARTERIAL

## 2020-02-06 MED ORDER — HEPARIN SODIUM (PORCINE) 1000 UNIT/ML IJ SOLN
INTRAMUSCULAR | Status: AC
  Filled 2020-02-06: qty 1

## 2020-02-06 MED ORDER — MIDAZOLAM HCL 2 MG/2ML IJ SOLN
INTRAMUSCULAR | Status: AC
  Filled 2020-02-06: qty 2

## 2020-02-06 MED ORDER — SODIUM CHLORIDE 0.9 % WEIGHT BASED INFUSION: 1.0000 mL/kg/h | INTRAVENOUS | Status: DC

## 2020-02-06 MED ORDER — SODIUM CHLORIDE 0.9 % WEIGHT BASED INFUSION: 3.0000 mL/kg/h | INTRAVENOUS | Status: DC

## 2020-02-06 MED ORDER — IOHEXOL 350 MG/ML SOLN
INTRAVENOUS | Status: DC | PRN
  Administered 2020-02-06: 14:00:00 120 mL via INTRA_ARTERIAL

## 2020-02-06 MED ORDER — HEPARIN SODIUM (PORCINE) 1000 UNIT/ML IJ SOLN
INTRAMUSCULAR | Status: DC | PRN
  Administered 2020-02-06: 6000 [IU] via INTRAVENOUS

## 2020-02-06 MED ORDER — HEPARIN (PORCINE) IN NACL 1000-0.9 UT/500ML-% IV SOLN
INTRAVENOUS | Status: DC | PRN
  Administered 2020-02-06 (×2): 500 mL

## 2020-02-06 MED ORDER — SODIUM CHLORIDE 0.9 % IV SOLN: INTRAVENOUS | Status: DC

## 2020-02-06 MED ORDER — SODIUM CHLORIDE 0.9% FLUSH
3.0000 mL | Freq: Two times a day (BID) | INTRAVENOUS | Status: DC
  Administered 2020-02-06: 11:00:00 3 mL via INTRAVENOUS

## 2020-02-06 MED ORDER — FENTANYL CITRATE (PF) 100 MCG/2ML IJ SOLN
INTRAMUSCULAR | Status: DC | PRN
  Administered 2020-02-06: 50 ug via INTRAVENOUS

## 2020-02-06 MED ORDER — LOSARTAN POTASSIUM 50 MG PO TABS
100.0000 mg | ORAL_TABLET | Freq: Every day | ORAL | Status: DC
  Administered 2020-02-07: 10:00:00 100 mg via ORAL
  Filled 2020-02-06: qty 2

## 2020-02-06 MED ORDER — MIDAZOLAM HCL 2 MG/2ML IJ SOLN
INTRAMUSCULAR | Status: DC | PRN
  Administered 2020-02-06: 2 mg via INTRAVENOUS

## 2020-02-06 SURGICAL SUPPLY — 12 items
CATH INFINITI 5 FR AR1 MOD (CATHETERS) ×2 IMPLANT
CATH INFINITI JR4 5F (CATHETERS) ×2 IMPLANT
CATH OPTITORQUE TIG 4.0 5F (CATHETERS) ×2 IMPLANT
DEVICE RAD COMP TR BAND LRG (VASCULAR PRODUCTS) ×2 IMPLANT
GLIDESHEATH SLEND SS 6F .021 (SHEATH) ×2 IMPLANT
GUIDEWIRE INQWIRE 1.5J.035X260 (WIRE) ×1 IMPLANT
INQWIRE 1.5J .035X260CM (WIRE) ×2
KIT HEART LEFT (KITS) ×2 IMPLANT
PACK CARDIAC CATHETERIZATION (CUSTOM PROCEDURE TRAY) ×2 IMPLANT
SHEATH PROBE COVER 6X72 (BAG) ×2 IMPLANT
TRANSDUCER W/STOPCOCK (MISCELLANEOUS) ×2 IMPLANT
TUBING CIL FLEX 10 FLL-RA (TUBING) ×2 IMPLANT

## 2020-02-06 NOTE — Progress Notes (Signed)
ANTICOAGULATION CONSULT NOTE   Pharmacy Consult for Heparin Indication: afib  Allergies  Allergen Reactions  . Benzonatate Shortness Of Breath  . Codeine     Other reaction(s): GI Upset (intolerance), Other (See Comments) Over sedates me Over sedates me Constipation    . Hydrochlorothiazide Anaphylaxis, Photosensitivity and Hives    Stomach issues    . Furosemide     Other reaction(s): Red Man Syndrome (ALLERGY)    Patient Measurements: Height: 5\' 9"  (175.3 cm) Weight: 127 kg (280 lb) IBW/kg (Calculated) : 70.7 HEPARIN DW (KG): 100   Vital Signs: Temp: 97.7 F (36.5 C) (12/22 0928) BP: 156/101 (12/22 1402) Pulse Rate: 94 (12/22 1402)  Labs: Recent Labs    02/04/20 1956 02/04/20 2254 02/05/20 0449 02/05/20 0532 02/05/20 1613 02/05/20 1905 02/06/20 0148  HGB 17.0  --  16.7  --   --   --  17.6*  HCT 50.4  --  49.1  --   --   --  51.1  PLT 185  --  177  --   --   --  211  HEPARINUNFRC  --   --   --  0.16* 0.27*  --  0.47  CREATININE 0.85  --  0.78  --   --   --  0.81  TROPONINIHS 247*   < > 263*  --  133* 133*  --    < > = values in this interval not displayed.    Estimated Creatinine Clearance: 119.9 mL/min (by C-G formula based on SCr of 0.81 mg/dL).   Medical History: Past Medical History:  Diagnosis Date  . Pericarditis     Medications:  Infusions:  . sodium chloride 50 mL/hr at 02/06/20 0800  . heparin     Assessment: 64 yo M presents with chest pain.  Troponin elevated.  CBC WNL, no bleeding noted.   Previously on Eliquis + flecanide for Afib which he stopped in March 2021.   Chest CT negative for PE  Pt is s/p cath. Heparin has been ordered to be resumed 8 hr post sheath removal for afib. He was therapeutic on the previous dose.  Sheath removed: 1330  Goal of Therapy:  Heparin level 0.3-0.7 units/ml Monitor platelets by anticoagulation protocol: Yes   Plan:  Resume heparin at 1850 units/hr at 2130 F/u with 6 hr HL Daily HL and  CBC  2131, PharmD, BCIDP, AAHIVP, CPP Infectious Disease Pharmacist 02/06/2020 2:23 PM

## 2020-02-06 NOTE — Consult Note (Addendum)
Cardiology Consultation:   Patient ID: Marcus Corrigandgar Albert Gullett Jr.; [id number]; [birth year]   Admit date: 02/04/2020 Date of Consult: 02/06/2020  Primary Care Provider: Lester CarolinaMcFadden, John C., Leonard Primary Cardiologist: Marcus to [facility]-Dr. Delton SeeNelson, Leonard   Patient Profile:   Marcus Corrigandgar Albert Bohlken Jr. is a 64 y.o. male with a hx of paroxysmal atrial fibrillation dx 04/2017 s/p DCCV to NSR with no recurrence>>no longer on Eliquis or flecainide, HTN, HLD, hx of pericardial effusion with pericarditis s/p large-volume pericardiocentesis 04/2017 and asthma who is being seen today for the evaluation of chest pain at the request of Marcus Leonard.  History of Present Illness:   Marcus Leonard is a 64 year old male with a history stated above who [facility] with anterior chest pain with radiation to his bilateral jaw and back which initially began early Sunday morning upon waking. He describes the pain as a "squeezing, pressure sensation" of moderate intensity. He states that the pressure was present most of the day on Sunday however did not seek medical attention. He called his PCP on Monday morning who then referred him to an urgent care center due to lack of appointment slots. At that time, patient reported symptoms were consistent with prior episode of pericarditis. BP at that time was 157/101.  Given his symptoms, he was prompted to go to [facility] although [facility]. On my interview, he reports that he had an unusual "flushing sensation" last Thursday but no chest pressure or pain. He has noticed increased fatigue and SOB over the last several weeks with no LE edema or orthopnea symptoms.  There was no N/V or diaphoresis with his episode on Sunday. He states that he has never undergone coronary evaluation with stress testing or LHC despite being previously on Flecainide.   He is followed by Marcus Leonard with Marcus Leonard for his cardiology care including  paroxysmal atrial fibrillation, essential hypertension history of pericarditis with pericardial effusion. Per chart review, he was diagnosed with atrial fibrillation in 2019 and underwent DCCV on 05/26/2017 with conversion to NSR and no recurrent atrial fibrillation. On last office visit 05/07/2019 plan was to discontinue his flecainide and Eliquis due to no recurrent AF. He was continued on metoprolol.  Additionally, in 04/2017 he was found to have a large volume pericardial effusion which required pericardiocentesis with no recurrence as well. He has no prior history of CAD. Echocardiogram from 05/18/2017 with normal LVEF at 60 to 65% with severely dilated left atrium, mild mitral regurgitation, trace aortic regurgitation.  Aorta was within normal limits at that time. CV risk factors include HTN, and HLD.  [facility], EKG performed which showed atrial fibrillation with moderate rate control at 90 bpm with inferior MI no acute ST changes. HsT found to be elevated at 247>>263>>133>>133.  CXR with enlarged cardiac silhouette and no acute airspace disease.  Chest CTA performed 02/05/2020 with no evidence of PE, trace pericardial effusion three-vessel coronary artery atherosclerosis reflux contrast into the IVC which is typically seen with elevated right heart pressures/right heart dysfunction.  He was subsequently started on IV heparin infusion with concern for ACS as well as recurrent AF.  Echocardiogram performed 02/05/2020 with normal LVEF at 60 to 65% with no regional wall motion abnormalities, moderate LVH, mild MR, mildly dilated ascending aorta measuring 42 mm and no significant pericardial effusion.  Past Medical History:  Diagnosis Date  . Pericarditis     History reviewed. No pertinent  surgical history.   Prior to Admission medications   Medication Sig Start Date End Date Taking? Authorizing Provider  albuterol (VENTOLIN HFA) 108 (90 Base) MCG/ACT inhaler Inhale 2 puffs into the lungs every  4 (four) hours as needed for wheezing or shortness of breath. 05/27/17  Yes Provider, Historical, Leonard  allopurinol (ZYLOPRIM) 100 MG tablet Take 100 mg by mouth daily. 01/28/20  Yes Provider, Historical, Leonard  aspirin 81 MG EC tablet Take 81 mg by mouth daily.   Yes Provider, Historical, Leonard  atorvastatin (LIPITOR) 40 MG tablet Take 40 mg by mouth daily. 10/05/19  Yes Provider, Historical, Leonard  cetirizine (ZYRTEC) 10 MG tablet Take 10 mg by mouth at bedtime.   Yes Provider, Historical, Leonard  colchicine 0.6 MG tablet Take 0.6 mg by mouth 2 (two) times daily. 10/13/19  Yes Provider, Historical, Leonard  divalproex (DEPAKOTE) 250 MG DR tablet Take 250-750 mg by mouth See admin instructions. 250 MG (1 tab) in the morning and 750 MG (3 tabs) in the evening. 10/10/19  Yes Provider, Historical, Leonard  DULoxetine (CYMBALTA) 60 MG capsule Take 60 mg by mouth at bedtime. 01/21/20  Yes Provider, Historical, Leonard  esomeprazole (NEXIUM) 40 MG capsule Take 40 mg by mouth daily. 10/03/19  Yes Provider, Historical, Leonard  FLOVENT HFA 220 MCG/ACT inhaler Inhale 2 puffs into the lungs 2 (two) times daily. 10/05/19  Yes Provider, Historical, Leonard  ketotifen (ZADITOR) 0.025 % ophthalmic solution Place 1 drop into both eyes 2 (two) times daily.   Yes Provider, Historical, Leonard  losartan (COZAAR) 50 MG tablet Take 50 mg by mouth daily. 01/23/20  Yes Provider, Historical, Leonard  montelukast (SINGULAIR) 10 MG tablet Take 10 mg by mouth daily. 10/03/19  Yes Provider, Historical, Leonard  Multiple Vitamins-Minerals (PRESERVISION AREDS 2 PO) Take 1 tablet by mouth in the morning and at bedtime.   Yes Provider, Historical, Leonard  propranolol (INDERAL) 40 MG tablet Take 40 mg by mouth 2 (two) times daily. 12/31/19  Yes Provider, Historical, Leonard  sertraline (ZOLOFT) 50 MG tablet Take 50 mg by mouth daily. 01/23/20  Yes Provider, Historical, Leonard  valACYclovir (VALTREX) 1000 MG tablet Take 1,000 mg by mouth daily. 10/18/19  Yes Provider, Historical, Leonard    Inpatient  Medications: Scheduled Meds: . allopurinol  100 mg Oral Daily  . aspirin EC  81 mg Oral Daily  . atorvastatin  40 mg Oral QPM  . colchicine  0.6 mg Oral BID  . divalproex  250 mg Oral Daily   And  . divalproex  750 mg Oral QHS  . DULoxetine  60 mg Oral QHS  . fluticasone  2 puff Inhalation BID  . ketotifen  1 drop Both Eyes BID  . loratadine  10 mg Oral QHS  . losartan  50 mg Oral QHS  . montelukast  10 mg Oral QHS  . pantoprazole  40 mg Oral Daily  . propranolol  40 mg Oral BID  . sertraline  50 mg Oral QHS  . valACYclovir  1,000 mg Oral Daily   Continuous Infusions: . sodium chloride 50 mL/hr at 02/05/20 2116  . heparin 1,850 Units/hr (02/06/20 0118)   PRN Meds: acetaminophen, albuterol, hydrALAZINE, ipratropium-albuterol, morphine injection, nitroGLYCERIN, ondansetron (ZOFRAN) IV  Allergies:    Allergies  Allergen Reactions  . Benzonatate Shortness Of Breath  . Codeine     Other reaction(s): GI Upset (intolerance), Other (See Comments) Over sedates me Over sedates me Constipation    . Hydrochlorothiazide Anaphylaxis, Photosensitivity and Hives  Stomach issues    . Furosemide     Other reaction(s): Red Man Syndrome (ALLERGY)    Social History:   Social History   Socioeconomic History  . Marital status: Married    Spouse name: Not on file  . Number of children: Not on file  . Years of education: Not on file  . Highest education level: Not on file  Occupational History  . Not on file  Tobacco Use  . Smoking status: Never Smoker  . Smokeless tobacco: Never Used  Substance and Sexual Activity  . Alcohol use: Never  . Drug use: Never  . Sexual activity: Not on file  Other Topics Concern  . Not on file  Social History Narrative  . Not on file   Social Determinants of Leonard   Financial Resource Strain: Not on file  Food Insecurity: Not on file  Transportation Needs: Not on file  Physical Activity: Not on file  Stress: Not on file  Social  Connections: Not on file  Intimate Partner Violence: Not on file    Family History:   Family History  Problem Relation Age of Onset  . Aneurysm Father    Family Status:  Family Status  Relation Name Status  . Mother  Alive  . Father  Deceased    ROS:  Please see the history of present illness.  All other ROS reviewed and negative.     Physical Exam/Data:   Vitals:   02/06/20 0138 02/06/20 0158 02/06/20 0240 02/06/20 0459  BP: (!) 149/110 (!) 152/101 (!) 157/90 (!) 142/90  Pulse:   87 81  Resp:    16  Temp:    97.7 F (36.5 C)  TempSrc:      SpO2:    96%  Weight:      Height:        Intake/Output Summary (Last 24 hours) at 02/06/2020 0730 Last data filed at 02/06/2020 0600 Gross per 24 hour  Intake 1067.6 ml  Output 1000 ml  Net 67.6 ml   Filed Weights   02/04/20 1935  Weight: 127 kg   Body mass index is 41.35 kg/m.   General: Obese, NAD Neck: Negative for carotid bruits. No JVD Lungs:Clear to ausculation bilaterally. No wheezes, rales, or rhonchi. Breathing is unlabored. Cardiovascular: Irregularly irregular. No murmurs, rubs, gallops, or LV heave appreciated. Abdomen: Soft, non-tender, non-distended. No obvious abdominal masses. Extremities: No edema. Radial pulses 2+ bilaterally Neuro: Alert and oriented. No focal deficits. No facial asymmetry. MAE spontaneously. Psych: Responds to questions appropriately with normal affect.    EKG:  The EKG was personally reviewed and demonstrates:  02/04/20 AF with HR 90bpm with evidence of old inferior MI and no acute ST changes  Telemetry:  Telemetry was personally reviewed and demonstrates: 02/06/20 AF with HR in the 70's   Relevant CV Studies:  Echocardiogram 02/05/2020:  1. Left ventricular ejection fraction, by estimation, is 60 to 65%. The  left ventricle has normal function. The left ventricle has no regional  wall motion abnormalities. There is moderate left ventricular hypertrophy.  Left ventricular  diastolic function  could not be evaluated.  2. Right ventricular systolic function is normal. The right ventricular  size is normal. Tricuspid regurgitation signal is inadequate for assessing  PA pressure.  3. The mitral valve is abnormal. Mild mitral valve regurgitation.  4. The aortic valve is tricuspid. Aortic valve regurgitation is not  visualized.  5. Aortic dilatation noted. There is mild dilatation of the ascending  aorta, measuring 42 mm.  6. The inferior vena cava is normal in size with greater than 50%  respiratory variability, suggesting right atrial pressure of 3 mmHg.  7. No significant pericardial effusion. RV apical fat pad noted.   Laboratory Data:  Chemistry Recent Labs  Lab 02/04/20 1956 02/05/20 0449 02/06/20 0148  NA 137 138 133*  K 4.2 3.9 3.6  CL 103 102 101  CO2 25 24 20*  GLUCOSE 122* 114* 145*  BUN 18 17 18   CREATININE 0.85 0.78 0.81  CALCIUM 9.1 8.9 8.8*  GFRNONAA >60 >60 >60  ANIONGAP 9 12 12     Total Protein  Date Value Ref Range Status  02/05/2020 6.7 6.5 - 8.1 g/dL Final   Albumin  Date Value Ref Range Status  02/05/2020 3.6 3.5 - 5.0 g/dL Final   AST  Date Value Ref Range Status  02/05/2020 22 15 - 41 U/L Final   ALT  Date Value Ref Range Status  02/05/2020 25 0 - 44 U/L Final   Alkaline Phosphatase  Date Value Ref Range Status  02/05/2020 90 38 - 126 U/L Final   Total Bilirubin  Date Value Ref Range Status  02/05/2020 0.8 0.3 - 1.2 mg/dL Final   Hematology Recent Labs  Lab 02/04/20 1956 02/05/20 0449 02/06/20 0148  WBC 12.2* 10.6* 10.5  RBC 5.24 5.16 5.42  HGB 17.0 16.7 17.6*  HCT 50.4 49.1 51.1  MCV 96.2 95.2 94.3  MCH 32.4 32.4 32.5  MCHC 33.7 34.0 34.4  RDW 12.8 13.0 12.8  PLT 185 177 211   Cardiac EnzymesNo results for input(s): TROPONINI in the last 168 hours. No results for input(s): TROPIPOC in the last 168 hours.  BNP Recent Labs  Lab 02/05/20 0450  BNP 218.8*    DDimer No results for  input(s): DDIMER in the last 168 hours. TSH:  Lab Results  Component Value Date   TSH 4.970 (H) 02/05/2020   Lipids: Lab Results  Component Value Date   CHOL 131 02/05/2020   HDL 28 (L) 02/05/2020   LDLCALC 89 02/05/2020   TRIG 69 02/05/2020   CHOLHDL 4.7 02/05/2020   HgbA1c:No results found for: HGBA1C  Radiology/Studies:  CT Head Wo Contrast  Result Date: 02/04/2020 CLINICAL DATA:  64 year old male with headache. Concern for intracranial hemorrhage. EXAM: CT HEAD WITHOUT CONTRAST TECHNIQUE: Contiguous axial images were obtained from the base of the skull through the vertex without intravenous contrast. COMPARISON:  None. FINDINGS: Brain: The ventricles and sulci appropriate size for patient's age. The gray-white matter discrimination is preserved. There is no acute intracranial hemorrhage. No mass effect midline shift no extra-axial fluid collection. Vascular: No hyperdense vessel or unexpected calcification. Skull: Normal. Negative for fracture or focal lesion. Sinuses/Orbits: Mild mucoperiosteal thickening of paranasal sinuses. No air-fluid level. The mastoid air cells are clear. Other: None IMPRESSION: Unremarkable noncontrast CT of the brain. Electronically Signed   By: Elgie Collard M.D.   On: 02/04/2020 23:27   CT ANGIO CHEST PE W OR WO CONTRAST  Result Date: 02/05/2020 CLINICAL DATA:  Chest pain radiating to the neck and back, states that this is similar to prior episode of pericarditis, negative COVID-19, elevated troponin EXAM: CT ANGIOGRAPHY CHEST WITH CONTRAST TECHNIQUE: Multidetector CT imaging of the chest was performed using the standard protocol during bolus administration of intravenous contrast. Multiplanar CT image reconstructions and MIPs were obtained to evaluate the vascular anatomy. CONTRAST:  OMNIPAQUE IOHEXOL 350 MG/ML SOLN COMPARISON:  CT 04/29/2017 FINDINGS: Cardiovascular: Satisfactory  opacification the pulmonary arteries to the segmental level. No  pulmonary artery filling defects are identified. Central pulmonary arteries are normal caliber. Cardiomegaly with biatrial enlargement. Three-vessel coronary artery atherosclerosis. There is only trace pericardial fluid at this time. Atherosclerotic plaque within the normal caliber aorta. Normal 3 vessel branching of the aortic arch with calcification of the proximal great vessels. Slight reflux of contrast into the IVC. No other major venous abnormality. Mediastinum/Nodes: No mediastinal fluid or gas. Normal thyroid gland and thoracic inlet. No acute abnormality of the trachea or esophagus. No worrisome mediastinal, hilar or axillary adenopathy. Lungs/Pleura: Limited assessment given respiratory motion artifact. Low lung volumes and atelectatic changes. No focal consolidative opacity, pneumothorax or effusion. No convincing CT features of edema. Few scattered calcified granulomata. No concerning pulmonary nodules or masses. Upper Abdomen: Some layering calcified gallstones or biliary sludge may be present within the gallbladder, incompletely visualized on this exam. 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 Musculoskeletal: Degenerative changes are present in the imaged spine and shoulders. No acute osseous abnormality or suspicious osseous lesion. No worrisome chest wall lesions. Review of the MIP images confirms the above findings. IMPRESSION: 1. No evidence of pulmonary embolism. 2. Trace pericardial effusion. Cardiomegaly with biatrial enlargement. Three-vessel coronary artery atherosclerosis. 3. Atelectatic changes and respiratory motion without other acute intrathoracic process. 4. Reflux of contrast into the IVC, can be seen with elevated right heart pressure/right heart dysfunction. 5. Cholelithiasis or biliary sludge may be present within the gallbladder, incompletely visualized on this exam. Correlate with exam findings and consider right upper quadrant ultrasound. 6. 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 7. Aortic Atherosclerosis (ICD10-I70.0). Electronically Signed   By: Kreg Shropshire M.D.   On: 02/05/2020 05:27   DG Chest Portable 1 View  Result Date: 02/04/2020 CLINICAL DATA:  Chest pain, shortness of breath EXAM: PORTABLE CHEST 1 VIEW COMPARISON:  12/19/2017 FINDINGS: Single frontal view of the chest demonstrates an enlarged cardiac silhouette. No acute airspace disease, effusion, or pneumothorax. No acute bony abnormalities. IMPRESSION: 1. Enlarged cardiac silhouette.  No acute airspace disease. Electronically Signed   By: Sharlet Salina M.D.   On: 02/04/2020 21:25   ECHOCARDIOGRAM COMPLETE  Result Date: 02/05/2020    ECHOCARDIOGRAM REPORT   Patient Name:   Javani Spratt. Date of Exam: 02/05/2020 Medical Rec #:  [id number]                Height:       69.0 in Accession #:    [id number]               Weight:       280.0 lb Date of Birth:  11-04-1955                BSA:          2.384 m Patient Age:    64 years                 BP:           151/96 mmHg Patient Gender: M                        HR:           72 bpm. Exam Location:  Inpatient Procedure: 2D Echo, Cardiac Doppler and Color Doppler Indications:    I31.3 Pericardial effusion (noninflammatory)  History:        Patient has no prior history of  Echocardiogram examinations.                 Abnormal ECG, Signs/Symptoms:Chest Pain; Risk                 Factors:Hypertension and Sleep Apnea.  Sonographer:    Sheralyn Boatman RDCS Referring Phys: [id number] Deno Lunger [facility]  Sonographer Comments: Technically difficult study due to poor echo windows and patient is morbidly obese. Image acquisition challenging due to patient body habitus. IMPRESSIONS  1. Left ventricular ejection fraction, by estimation, is 60 to 65%. The left ventricle has normal function. The left ventricle has no regional wall motion abnormalities. There is moderate left ventricular hypertrophy. Left ventricular diastolic function  could not be  evaluated.  2. Right ventricular systolic function is normal. The right ventricular size is normal. Tricuspid regurgitation signal is inadequate for assessing PA pressure.  3. The mitral valve is abnormal. Mild mitral valve regurgitation.  4. The aortic valve is tricuspid. Aortic valve regurgitation is not visualized.  5. Aortic dilatation noted. There is mild dilatation of the ascending aorta, measuring 42 mm.  6. The inferior vena cava is normal in size with greater than 50% respiratory variability, suggesting right atrial pressure of 3 mmHg.  7. No significant pericardial effusion. RV apical fat pad noted. Comparison(s): No prior Echocardiogram. FINDINGS  Left Ventricle: Left ventricular ejection fraction, by estimation, is 60 to 65%. The left ventricle has normal function. The left ventricle has no regional wall motion abnormalities. The left ventricular internal cavity size was normal in size. There is  moderate left ventricular hypertrophy. Left ventricular diastolic function could not be evaluated due to atrial fibrillation. Left ventricular diastolic function could not be evaluated. Right Ventricle: The right ventricular size is normal. No increase in right ventricular wall thickness. Right ventricular systolic function is normal. Tricuspid regurgitation signal is inadequate for assessing PA pressure. Left Atrium: Left atrial size was normal in size. Right Atrium: Right atrial size was normal in size. Pericardium: There is no evidence of pericardial effusion. The pericardial effusion is circumferential. Presence of pericardial fat pad. Mitral Valve: The mitral valve is abnormal. There is mild thickening of the mitral valve leaflet(s). Mild mitral valve regurgitation, with posteriorly-directed jet. Tricuspid Valve: The tricuspid valve is grossly normal. Tricuspid valve regurgitation is trivial. Aortic Valve: The aortic valve is tricuspid. Aortic valve regurgitation is not visualized. Pulmonic Valve: The  pulmonic valve was grossly normal. Pulmonic valve regurgitation is trivial. Aorta: Aortic dilatation noted. There is mild dilatation of the ascending aorta, measuring 42 mm. Venous: The inferior vena cava is normal in size with greater than 50% respiratory variability, suggesting right atrial pressure of 3 mmHg. IAS/Shunts: No atrial level shunt detected by color flow Doppler.  LEFT VENTRICLE PLAX 2D LVIDd:         4.80 cm LVIDs:         3.10 cm LV PW:         1.65 cm LV IVS:        1.45 cm LVOT diam:     2.20 cm LV SV:         51 LV SV Index:   22 LVOT Area:     3.80 cm  LV Volumes (MOD) LV vol d, MOD A2C: 110.0 ml LV vol d, MOD A4C: 113.0 ml LV vol s, MOD A2C: 46.7 ml LV vol s, MOD A4C: 52.2 ml LV SV MOD A2C:     63.3 ml LV SV MOD A4C:  113.0 ml LV SV MOD BP:      63.0 ml RIGHT VENTRICLE             IVC RV S prime:     12.50 cm/s  IVC diam: 1.30 cm TAPSE (M-mode): 1.6 cm LEFT ATRIUM             Index       RIGHT ATRIUM           Index LA diam:        4.80 cm 2.01 cm/m  RA Area:     16.00 cm LA Vol (A2C):   63.2 ml 26.51 ml/m RA Volume:   43.20 ml  18.12 ml/m LA Vol (A4C):   76.1 ml 31.93 ml/m LA Biplane Vol: 72.6 ml 30.46 ml/m  AORTIC VALVE             PULMONIC VALVE LVOT Vmax:   75.40 cm/s  PR End Diast Vel: 1.92 msec LVOT Vmean:  55.500 cm/s LVOT VTI:    0.135 m  AORTA Ao Root diam: 3.60 cm Ao Asc diam:  4.00 cm MITRAL VALVE MV Area (PHT): 6.17 cm      SHUNTS MV Decel Time: 123 msec      Systemic VTI:  0.14 m MR Peak grad:    124.5 mmHg  Systemic Diam: 2.20 cm MR Mean grad:    90.0 mmHg MR Vmax:         558.00 cm/s MR Vmean:        454.0 cm/s MR PISA:         2.26 cm MR PISA Eff ROA: 14 mm MR PISA Radius:  0.60 cm MV E velocity: 126.00 cm/s Zoila Shutter Leonard Electronically signed by Zoila Shutter Leonard Signature Date/Time: 02/05/2020/11:11:32 AM    Final     Assessment and Plan:   1. NSTEMI: -Pt presented to [facility] 02/04/2020 with anterior chest pain with radiation to his bilateral jaw and back  which initially began early Sunday morning upon waking escirbed as a "squeezing, pressure sensation" of moderate intensity which lasted most of the day on Sunday>>releived with SL NTG. He has a prior hx of PAF however was recently taken off his Flecainide and Eliquis due to no recurrent symptoms 04/2019 by his cardiologist. EKG on arrival showed AF with relatively good rate control at 90bpm. He was placed in IV Heparin out of concern for ACS and AF. HsT were elevated at 247>>263>>133>>133.   -CXR with enlarged cardiac silhouette and no acute airspace disease.   -Chest CTA performed 02/05/2020 with no evidence of PE, trace pericardial effusion, three-vessel coronary artery atherosclerosis reflux contrast into the IVC which is typically seen with elevated right heart pressures/right heart dysfunction. -Echocardiogram performed 02/05/2020 with normal LVEF at 60 to 65% with no regional wall motion abnormalities, moderate LVH, mild MR, mildly dilated ascending aorta measuring 42 mm and no significant pericardial effusion. -Continue IV Heparin for now and plan for either cCTA versus LHC given elevated HsT as well as 3 vessel CAD on chest CTA. He has never undergone stress testing or other cardiac evaluation despite previously on flecainide for PAF>>>to discuss plan with Dr. Delton See  -Continue ASA, beta blocker, statin   2. Recurrent PAF: -Has a known hx of PAF s/p DCCV with no recurrent AF. He was last seen by his cardiologist 04/2019 and was taken off [facility] as well as Flecainide. EKG on arrival with AF with a rate of 90bpm -Telemetry shows persistent AF with  stable rates  -He is currently being anticoagulated with IV Heparin as above>>>continue given the need for further ischemic evaluation  -Will need to transition to PO Eliquis prior to d/c -May consider repeat DCCV/TEE or plan for OP DCCV once anticoagulated for 3-4 weeks if no spontaneous conversion to NSR  -Continue beta blocker    2. HTN: -Elevated,  142/90>>157/90>>152/101 -Currently on propanolol and losartan -Will change propanolol to carvedilol for better BP control  -Will make further adjustments as we follow through his hospital stay   3. Dilated ascending aorta: -Echocardiogram from 05/18/2017 with normal LVEF at 60 to 65% with severely dilated left atrium, mild mitral regurgitation, trace aortic regurgitation. Aorta was within normal limits at that time.  -Echocardiogram performed 02/05/2020 with normal LVEF at 60 to 65% with no regional wall motion abnormalities, moderate LVH, mild MR, mildly dilated ascending aorta measuring 42 mm and no significant pericardial effusion. -Continue with aggressive BP control and follow with serial imaging   4. Hx of pericardial effusion: -Dx 04/2017 s/p large volume pericardiocentesis with no recurrence -Echo this admission without pericardial effusion     For questions or updates, please contact [facility] Please consult [email or web address] for contact info under Cardiology/STEMI.   SignedGeorgie Chard NP-C [facility] Pager: [phone] 02/06/2020 7:30 AM  The patient was seen, examined and discussed with Georgie Chard, NP  and I agree with the above.   64 y.o. male with a hx of paroxysmal atrial fibrillation dx 04/2017 s/p DCCV to NSR with no recurrence>>no longer on Eliquis or flecainide, HTN, HLD, hx of pericardial effusion with pericarditis s/p large-volume pericardiocentesis 04/2017 and asthma who is being seen today for the evaluation of chest pain at the request of Dr. Margo Aye. He presented to [facility] on [facility]/04/2020 with anterior chest pain with radiation to his bilateral jaw and back which initially began early Sunday morning upon waking.  The patient is fairly active in attend [employer] sneakers but has not been able to go in the last couple weeks as he felt short of breath.   He has never smoked, his mother had myocardial infarction at age of 56. He is currently chest pain-free. On arrival his  troponin was 247, then 263 then 133.  Atrial fibrillation with nonspecific ST-T wave abnormalities, we do not have prior EKG for comparison.  On physical exam he is not in acute distress, he has no JVDs and no carotid bruits, his lungs are clear, and he has irregular heart rhythm with no obvious murmur.  His lower extremities have chronic venous stasis changes but strong peripheral pulses. I have reviewed his chest CTA performed for pulmonary embolism and it shows diffuse coronary calcification especially in LAD, also diffuse atherosclerotic disease in his chest aorta.  I think that the best approach would be to perform cardiac catheterization to rule out obstructive CAD. His blood pressure poorly controlled, I would increase his losartan to 100 mg daily and follow.  Continue aspirin and atorvastatin.  We will continue heparin, he will need to be switched to Eliquis on discharge for underlying atrial fibrillation.  We will refer him to our A. fib clinic.  Tobias Alexander 02/06/2020

## 2020-02-06 NOTE — Progress Notes (Signed)
PROGRESS NOTE    Marcus CorriganEdgar Albert Melick Jr.  NWG:[id number] DOB: 05-09-1955 DOA: 02/04/2020 PCP: Marcus Leonard    Chief Complaint  Patient presents with  . Chest Pain    Brief Narrative:  Marcus Corrigandgar Albert Marcus Jr. is a 64 y.o. male with a hx of paroxysmal atrial fibrillation dx 04/2017 s/p DCCV to NSR with no recurrence>>no longer on Eliquis or flecainide, HTN, HLD, hx of pericardial effusion with pericarditis s/p large-volume pericardiocentesis 04/2017 and asthma who is being seen today for the evaluation of chest pain  Assessment & Plan:   Principal Problem:   Chest pain Active Problems:   AF (paroxysmal atrial fibrillation) (HCC)   Mixed hyperlipidemia   OSA on CPAP   Essential hypertension   GERD without esophagitis   NSTEMI: Pt reports chest pain. Mildly elevated troponins.  She was started on IV Heparin.  Chest CTA performed 02/05/2020 with no evidence of PE, trace pericardial effusion, three-vessel coronary artery atherosclerosis reflux contrast into the IVC which is typically seen with elevated right heart pressures/right heart dysfunction. Echocardiogram showed LVef oF 60% WITH no regional wall motion abnormalities.  Cardiac cath SCHEDULED later today.  Continue with aspirin, statin,.    Atrial fibrillation:  S/p cardioversion.   Rate controlled.    OSA on CPAP:     Essential hypertension:  Well controlled.  Resume home meds.     GERD Stable.    Hyperlipidemia:  Resume statin.    DVT prophylaxis: Heparin. Code Status: (Full code.) Family Communication: none at bedside.  Status is: Inpatient  Remains inpatient appropriate because:Ongoing diagnostic testing needed not appropriate for outpatient work up   Dispo:  Patient From: Home  Planned Disposition: Home  Expected discharge date: 02/06/2020  Medically stable for discharge: No        Consultants:   Cardiology.    Procedures: cardiac cath  Scheduled for  today   Antimicrobials: none.    Subjective: No chest pain.   Objective: Vitals:   02/06/20 0852 02/06/20 0928 02/06/20 1140 02/06/20 1237  BP:  (!) 152/107 (!) 166/103   Pulse:  89 77   Resp:  18 19   Temp:  97.7 F (36.5 C)    TempSrc:      SpO2: 97% 98% 97% 99%  Weight:      Height:        Intake/Output Summary (Last 24 hours) at 02/06/2020 1239 Last data filed at 02/06/2020 0800 Gross per 24 hour  Intake 1159.67 ml  Output 1000 ml  Net 159.67 ml   Filed Weights   02/04/20 1935  Weight: 127 kg    Examination:  General exam: Appears calm and comfortable  Respiratory system: Clear to auscultation. Respiratory effort normal. Cardiovascular system: S1 & S2 heard, RRR. No JVD, . No pedal edema. Gastrointestinal system: Abdomen is nondistended, soft and nontender.  Normal bowel sounds heard. Central nervous system: Alert and oriented. No focal neurological deficits. Extremities: Symmetric 5 x 5 power. Skin: No rashes, lesions or ulcers Psychiatry: Mood & affect appropriate.     Data Reviewed: I have personally reviewed following labs and imaging studies  CBC: Recent Labs  Lab 02/04/20 1956 02/05/20 0449 02/06/20 0148  WBC 12.2* 10.6* 10.5  HGB 17.0 16.7 17.6*  HCT 50.4 49.1 51.1  MCV 96.2 95.2 94.3  PLT 185 177 211    Basic Metabolic Panel: Recent Labs  Lab 02/04/20 1956 02/05/20 0449 02/06/20 0148  NA 137 138 133*  K 4.2 3.9 3.6  CL 103 102 101  CO2 25 24 20*  GLUCOSE 122* 114* 145*  BUN CREATININE 0.85 0.78 0.81  CALCIUM 9.1 8.9 8.8*  MG  --  2.1 2.1    GFR: Estimated Creatinine Clearance: 119.9 mL/min (by C-G formula based on SCr of 0.81 mg/dL).  Liver Function Tests: Recent Labs  Lab 02/05/20 0449  AST 22  ALT 25  ALKPHOS 90  BILITOT 0.8  PROT 6.7  ALBUMIN 3.6    CBG: No results for input(s): GLUCAP in the last 168 hours.   Recent Results (from the past 240 hour(s))  Resp Panel by RT-PCR (Flu A&B, Covid)  Nasopharyngeal Swab     Status: None   Collection Time: 02/04/20 10:57 PM   Specimen: Nasopharyngeal Swab; Nasopharyngeal(NP) swabs in vial transport medium  Result Value Ref Range Status   SARS Coronavirus 2 by RT PCR NEGATIVE NEGATIVE Final    Comment: (NOTE) SARS-CoV-2 target nucleic acids are NOT DETECTED.  The SARS-CoV-2 RNA is generally detectable in upper respiratory specimens during the acute phase of infection. The lowest concentration of SARS-CoV-2 viral copies this assay can detect is 138 copies/mL. A negative result does not preclude SARS-Cov-2 infection and should not be used as the sole basis for treatment or other patient management decisions. A negative result may occur with  improper specimen collection/handling, submission of specimen other than nasopharyngeal swab, presence of viral mutation(s) within the areas targeted by this assay, and inadequate number of viral copies(<138 copies/mL). A negative result must be combined with clinical observations, patient history, and epidemiological information. The expected result is Negative.  Fact Sheet for Patients:  BloggerCourse.com  Fact Sheet for Healthcare Providers:  SeriousBroker.it  This test is no t yet approved or cleared by the [place] FDA and  has been authorized for detection and/or diagnosis of SARS-CoV-2 by FDA under an Emergency Use Authorization (EUA). This EUA will remain  in effect (meaning this test can be used) for the duration of the COVID-19 declaration under Section 564(b)(1) of the Act, 21 U.S.C.section 360bbb-3(b)(1), unless the authorization is terminated  or revoked sooner.       Influenza A by PCR NEGATIVE NEGATIVE Final   Influenza B by PCR NEGATIVE NEGATIVE Final    Comment: (NOTE) The Xpert Xpress SARS-CoV-2/FLU/RSV plus assay is intended as an aid in the diagnosis of influenza from Nasopharyngeal swab specimens and should not be  used as a sole basis for treatment. Nasal washings and aspirates are unacceptable for Xpert Xpress SARS-CoV-2/FLU/RSV testing.  Fact Sheet for Patients: BloggerCourse.com  Fact Sheet for Healthcare Providers: SeriousBroker.it  This test is not yet approved or cleared by the [place] FDA and has been authorized for detection and/or diagnosis of SARS-CoV-2 by FDA under an Emergency Use Authorization (EUA). This EUA will remain in effect (meaning this test can be used) for the duration of the COVID-19 declaration under Section 564(b)(1) of the Act, 21 U.S.C. section 360bbb-3(b)(1), unless the authorization is terminated or revoked.  Performed at [facility], [address], [place], [place] [address]          Radiology Studies: CT Head Wo Contrast  Result Date: 02/04/2020 CLINICAL DATA:  64 year old male with headache. Concern for intracranial hemorrhage. EXAM: CT HEAD WITHOUT CONTRAST TECHNIQUE: Contiguous axial images were obtained from the base of the skull through the vertex without intravenous contrast. COMPARISON:  None. FINDINGS: Brain: The ventricles and sulci appropriate size for patient's age. The gray-white matter  discrimination is preserved. There is no acute intracranial hemorrhage. No mass effect midline shift no extra-axial fluid collection. Vascular: No hyperdense vessel or unexpected calcification. Skull: Normal. Negative for fracture or focal lesion. Sinuses/Orbits: Mild mucoperiosteal thickening of paranasal sinuses. No air-fluid level. The mastoid air cells are clear. Other: None IMPRESSION: Unremarkable noncontrast CT of the brain. Electronically Signed   By: Elgie Collard M.D.   On: 02/04/2020 23:27   CT ANGIO CHEST PE W OR WO CONTRAST  Result Date: 02/05/2020 CLINICAL DATA:  Chest pain radiating to the neck and back, states that this is similar to prior episode of pericarditis,  negative COVID-19, elevated troponin EXAM: CT ANGIOGRAPHY CHEST WITH CONTRAST TECHNIQUE: Multidetector CT imaging of the chest was performed using the standard protocol during bolus administration of intravenous contrast. Multiplanar CT image reconstructions and MIPs were obtained to evaluate the vascular anatomy. CONTRAST:  OMNIPAQUE IOHEXOL 350 MG/ML SOLN COMPARISON:  CT 04/29/2017 FINDINGS: Cardiovascular: Satisfactory opacification the pulmonary arteries to the segmental level. No pulmonary artery filling defects are identified. Central pulmonary arteries are normal caliber. Cardiomegaly with biatrial enlargement. Three-vessel coronary artery atherosclerosis. There is only trace pericardial fluid at this time. Atherosclerotic plaque within the normal caliber aorta. Normal 3 vessel branching of the aortic arch with calcification of the proximal great vessels. Slight reflux of contrast into the IVC. No other major venous abnormality. Mediastinum/Nodes: No mediastinal fluid or gas. Normal thyroid gland and thoracic inlet. No acute abnormality of the trachea or esophagus. No worrisome mediastinal, hilar or axillary adenopathy. Lungs/Pleura: Limited assessment given respiratory motion artifact. Low lung volumes and atelectatic changes. No focal consolidative opacity, pneumothorax or effusion. No convincing CT features of edema. Few scattered calcified granulomata. No concerning pulmonary nodules or masses. Upper Abdomen: Some layering calcified gallstones or biliary sludge may be present within the gallbladder, incompletely visualized on this exam. 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 Musculoskeletal: Degenerative changes are present in the imaged spine and shoulders. No acute osseous abnormality or suspicious osseous lesion. No worrisome chest wall lesions. Review of the MIP images confirms the above findings. IMPRESSION: 1. No evidence of pulmonary embolism. 2. Trace pericardial effusion.  Cardiomegaly with biatrial enlargement. Three-vessel coronary artery atherosclerosis. 3. Atelectatic changes and respiratory motion without other acute intrathoracic process. 4. Reflux of contrast into the IVC, can be seen with elevated right heart pressure/right heart dysfunction. 5. Cholelithiasis or biliary sludge may be present within the gallbladder, incompletely visualized on this exam. Correlate with exam findings and consider right upper quadrant ultrasound. 6. 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 7. Aortic Atherosclerosis (ICD10-I70.0). Electronically Signed   By: Kreg Shropshire M.D.   On: 02/05/2020 05:27   DG Chest Portable 1 View  Result Date: 02/04/2020 CLINICAL DATA:  Chest pain, shortness of breath EXAM: PORTABLE CHEST 1 VIEW COMPARISON:  12/19/2017 FINDINGS: Single frontal view of the chest demonstrates an enlarged cardiac silhouette. No acute airspace disease, effusion, or pneumothorax. No acute bony abnormalities. IMPRESSION: 1. Enlarged cardiac silhouette.  No acute airspace disease. Electronically Signed   By: Sharlet Salina M.D.   On: 02/04/2020 21:25   ECHOCARDIOGRAM COMPLETE  Result Date: 02/05/2020    ECHOCARDIOGRAM REPORT   Patient Name:   Marcus Leonard. Date of Exam: 02/05/2020 Medical Rec #:  [id number]                Height:       69.0 in Accession #:    [id number]  Weight:       280.0 lb Date of Birth:  06/27/1955                BSA:          2.384 m Patient Age:    64 years                 BP:           151/96 mmHg Patient Gender: M                        HR:           72 bpm. Exam Location:  Inpatient Procedure: 2D Echo, Cardiac Doppler and Color Doppler Indications:    I31.3 Pericardial effusion (noninflammatory)  History:        Patient has no prior history of Echocardiogram examinations.                 Abnormal ECG, Signs/Symptoms:Chest Pain; Risk                 Factors:Hypertension and Sleep Apnea.  Sonographer:    Sheralyn Boatman RDCS Referring Phys: [id number] Deno Lunger [facility]  Sonographer Comments: Technically difficult study due to poor echo windows and patient is morbidly obese. Image acquisition challenging due to patient body habitus. IMPRESSIONS  1. Left ventricular ejection fraction, by estimation, is 60 to 65%. The left ventricle has normal function. The left ventricle has no regional wall motion abnormalities. There is moderate left ventricular hypertrophy. Left ventricular diastolic function  could not be evaluated.  2. Right ventricular systolic function is normal. The right ventricular size is normal. Tricuspid regurgitation signal is inadequate for assessing PA pressure.  3. The mitral valve is abnormal. Mild mitral valve regurgitation.  4. The aortic valve is tricuspid. Aortic valve regurgitation is not visualized.  5. Aortic dilatation noted. There is mild dilatation of the ascending aorta, measuring 42 mm.  6. The inferior vena cava is normal in size with greater than 50% respiratory variability, suggesting right atrial pressure of 3 mmHg.  7. No significant pericardial effusion. RV apical fat pad noted. Comparison(s): No prior Echocardiogram. FINDINGS  Left Ventricle: Left ventricular ejection fraction, by estimation, is 60 to 65%. The left ventricle has normal function. The left ventricle has no regional wall motion abnormalities. The left ventricular internal cavity size was normal in size. There is  moderate left ventricular hypertrophy. Left ventricular diastolic function could not be evaluated due to atrial fibrillation. Left ventricular diastolic function could not be evaluated. Right Ventricle: The right ventricular size is normal. No increase in right ventricular wall thickness. Right ventricular systolic function is normal. Tricuspid regurgitation signal is inadequate for assessing PA pressure. Left Atrium: Left atrial size was normal in size. Right Atrium: Right atrial size was normal in size. Pericardium:  There is no evidence of pericardial effusion. The pericardial effusion is circumferential. Presence of pericardial fat pad. Mitral Valve: The mitral valve is abnormal. There is mild thickening of the mitral valve leaflet(s). Mild mitral valve regurgitation, with posteriorly-directed jet. Tricuspid Valve: The tricuspid valve is grossly normal. Tricuspid valve regurgitation is trivial. Aortic Valve: The aortic valve is tricuspid. Aortic valve regurgitation is not visualized. Pulmonic Valve: The pulmonic valve was grossly normal. Pulmonic valve regurgitation is trivial. Aorta: Aortic dilatation noted. There is mild dilatation of the ascending aorta, measuring 42 mm. Venous: The inferior vena cava is normal in size with greater than 50%  respiratory variability, suggesting right atrial pressure of 3 mmHg. IAS/Shunts: No atrial level shunt detected by color flow Doppler.  LEFT VENTRICLE PLAX 2D LVIDd:         4.80 cm LVIDs:         3.10 cm LV PW:         1.65 cm LV IVS:        1.45 cm LVOT diam:     2.20 cm LV SV:         51 LV SV Index:   22 LVOT Area:     3.80 cm  LV Volumes (MOD) LV vol d, MOD A2C: 110.0 ml LV vol d, MOD A4C: 113.0 ml LV vol s, MOD A2C: 46.7 ml LV vol s, MOD A4C: 52.2 ml LV SV MOD A2C:     63.3 ml LV SV MOD A4C:     113.0 ml LV SV MOD BP:      63.0 ml RIGHT VENTRICLE             IVC RV S prime:     12.50 cm/s  IVC diam: 1.30 cm TAPSE (M-mode): 1.6 cm LEFT ATRIUM             Index       RIGHT ATRIUM           Index LA diam:        4.80 cm 2.01 cm/m  RA Area:     16.00 cm LA Vol (A2C):   63.2 ml 26.51 ml/m RA Volume:   43.20 ml  18.12 ml/m LA Vol (A4C):   76.1 ml 31.93 ml/m LA Biplane Vol: 72.6 ml 30.46 ml/m  AORTIC VALVE             PULMONIC VALVE LVOT Vmax:   75.40 cm/s  PR End Diast Vel: 1.92 msec LVOT Vmean:  55.500 cm/s LVOT VTI:    0.135 m  AORTA Ao Root diam: 3.60 cm Ao Asc diam:  4.00 cm MITRAL VALVE MV Area (PHT): 6.17 cm      SHUNTS MV Decel Time: 123 msec      Systemic VTI:  0.14 m MR  Peak grad:    124.5 mmHg  Systemic Diam: 2.20 cm MR Mean grad:    90.0 mmHg MR Vmax:         558.00 cm/s MR Vmean:        454.0 cm/s MR PISA:         2.26 cm MR PISA Eff ROA: 14 mm MR PISA Radius:  0.60 cm MV E velocity: 126.00 cm/s Zoila Shutter Leonard Electronically signed by Zoila Shutter Leonard Signature Date/Time: 02/05/2020/11:11:32 AM    Final         Scheduled Meds: . [MAR Hold] allopurinol  100 mg Oral Daily  . [MAR Hold] aspirin EC  81 mg Oral Daily  . [MAR Hold] atorvastatin  40 mg Oral QPM  . [MAR Hold] carvedilol  6.25 mg Oral BID WC  . [MAR Hold] colchicine  0.6 mg Oral BID  . [MAR Hold] divalproex  250 mg Oral Daily   And  . [MAR Hold] divalproex  750 mg Oral QHS  . [MAR Hold] DULoxetine  60 mg Oral QHS  . [MAR Hold] fluticasone  2 puff Inhalation BID  . [MAR Hold] ketotifen  1 drop Both Eyes BID  . [MAR Hold] loratadine  10 mg Oral QHS  . [MAR Hold] losartan  100 mg Oral Daily  . [facility]  Hold] montelukast  10 mg Oral QHS  . [MAR Hold] pantoprazole  40 mg Oral Daily  . [MAR Hold] sertraline  50 mg Oral QHS  . [MAR Hold] sodium chloride flush  3 mL Intravenous Q12H  . [MAR Hold] valACYclovir  1,000 mg Oral Daily   Continuous Infusions: . sodium chloride 50 mL/hr at 02/06/20 0800  . sodium chloride    . heparin 1,850 Units/hr (02/06/20 0800)     LOS: 1 day       Kathlen Mody, Leonard [facility]   To contact the attending provider between 7A-7P or the covering provider during after hours 7P-7A, please log into the web site [email or web address] and access using universal [place] password for that web site. If you do not have the password, please call the hospital operator.  02/06/2020, 12:39 PM

## 2020-02-06 NOTE — Progress Notes (Signed)
RT note. Pt. Placed on auto titrate CPAP. 20/5. VS stable. RT will continue to monitor.

## 2020-02-06 NOTE — Plan of Care (Signed)

## 2020-02-06 NOTE — Progress Notes (Signed)
ANTICOAGULATION CONSULT NOTE   Pharmacy Consult for Heparin Indication: chest pain/ACS  Allergies  Allergen Reactions  . Benzonatate Shortness Of Breath  . Codeine     Other reaction(s): GI Upset (intolerance), Other (See Comments) Over sedates me Over sedates me Constipation    . Hydrochlorothiazide Anaphylaxis, Photosensitivity and Hives    Stomach issues    . Furosemide     Other reaction(s): Red Man Syndrome (ALLERGY)    Patient Measurements: Height: 5\' 9"  (175.3 cm) Weight: 127 kg (280 lb) IBW/kg (Calculated) : 70.7 HEPARIN DW (KG): 100   Vital Signs: Temp: 99.1 F (37.3 C) (12/21 2109) Temp Source: Oral (12/21 2109) BP: 157/90 (12/22 0240) Pulse Rate: 87 (12/22 0240)  Labs: Recent Labs    02/04/20 1956 02/04/20 2254 02/05/20 0449 02/05/20 0532 02/05/20 1613 02/05/20 1905 02/06/20 0148  HGB 17.0  --  16.7  --   --   --  17.6*  HCT 50.4  --  49.1  --   --   --  51.1  PLT 185  --  177  --   --   --  211  HEPARINUNFRC  --   --   --  0.16* 0.27*  --  0.47  CREATININE 0.85  --  0.78  --   --   --  0.81  TROPONINIHS 247*   < > 263*  --  133* 133*  --    < > = values in this interval not displayed.    Estimated Creatinine Clearance: 119.9 mL/min (by C-G formula based on SCr of 0.81 mg/dL).   Medical History: Past Medical History:  Diagnosis Date  . Pericarditis     Medications:  Infusions:  . sodium chloride 50 mL/hr at 02/05/20 2116  . heparin 1,850 Units/hr (02/06/20 0118)   Assessment: 64 yo M presents with chest pain.  Troponin elevated.  CBC WNL, no bleeding noted.   Previously on Eliquis + flecanide for Afib which he stopped in March 2021.   Chest CT negative for PE  02/06/2020:  Heparin level 0.47- therapeutic on heparin infusion at 1850 units/hr  CBC- WNL  No bleeding or infusion related issues per RN  Goal of Therapy:  Heparin level 0.3-0.7 units/ml Monitor platelets by anticoagulation protocol: Yes   Plan:  Continue  Heparin infusion at 1850 units/hr Daily heparin level & CBC while on heparin Monitor closely for bleeding Noted plan to transfer to [facility] for cardiac cath later today  [facility] PharmD, BCPS 02/06/2020,3:00 AM

## 2020-02-07 ENCOUNTER — Encounter (HOSPITAL_COMMUNITY): Payer: Self-pay | Admitting: Internal Medicine

## 2020-02-07 DIAGNOSIS — I25118 Atherosclerotic heart disease of native coronary artery with other forms of angina pectoris: Secondary | ICD-10-CM

## 2020-02-07 LAB — CBC
HCT: 47.8 % (ref 39.0–52.0)
Hemoglobin: 17 g/dL (ref 13.0–17.0)
MCH: 32.5 pg (ref 26.0–34.0)
MCHC: 35.6 g/dL (ref 30.0–36.0)
MCV: 91.4 fL (ref 80.0–100.0)
Platelets: 211 10*3/uL (ref 150–400)
RBC: 5.23 MIL/uL (ref 4.22–5.81)
RDW: 12.9 % (ref 11.5–15.5)
WBC: 8.5 10*3/uL (ref 4.0–10.5)
nRBC: 0 % (ref 0.0–0.2)

## 2020-02-07 LAB — BASIC METABOLIC PANEL
Anion gap: 10 (ref 5–15)
BUN: 13 mg/dL (ref 8–23)
CO2: 22 mmol/L (ref 22–32)
Calcium: 8.7 mg/dL — ABNORMAL LOW (ref 8.9–10.3)
Chloride: 103 mmol/L (ref 98–111)
Creatinine, Ser: 0.9 mg/dL (ref 0.61–1.24)
GFR, Estimated: >60 mL/min (ref >60)
Glucose, Bld: 147 mg/dL — ABNORMAL HIGH (ref 70–99)
Potassium: 3.8 mmol/L (ref 3.5–5.1)
Sodium: 135 mmol/L (ref 135–145)

## 2020-02-07 LAB — HEPARIN LEVEL (UNFRACTIONATED): Heparin Unfractionated: 0.16 IU/mL — ABNORMAL LOW (ref 0.30–0.70)

## 2020-02-07 LAB — MAGNESIUM: Magnesium: 2 mg/dL (ref 1.7–2.4)

## 2020-02-07 MED ORDER — APIXABAN 5 MG PO TABS: 5.0000 mg | ORAL_TABLET | Freq: Two times a day (BID) | ORAL | 0 refills | Status: AC

## 2020-02-07 MED ORDER — CARVEDILOL 25 MG PO TABS
25.0000 mg | ORAL_TABLET | Freq: Two times a day (BID) | ORAL | 0 refills | Status: AC
Start: 2020-02-07 — End: ?

## 2020-02-07 MED ORDER — DILTIAZEM HCL ER COATED BEADS 120 MG PO CP24
120.0000 mg | ORAL_CAPSULE | Freq: Every day | ORAL | Status: DC
  Administered 2020-02-07: 13:00:00 120 mg via ORAL
  Filled 2020-02-07: qty 1

## 2020-02-07 MED ORDER — LOSARTAN POTASSIUM 100 MG PO TABS: 100.0000 mg | ORAL_TABLET | Freq: Every day | ORAL | 0 refills | Status: DC

## 2020-02-07 MED ORDER — APIXABAN 5 MG PO TABS
5.0000 mg | ORAL_TABLET | Freq: Two times a day (BID) | ORAL | Status: DC
  Administered 2020-02-07: 13:00:00 5 mg via ORAL
  Filled 2020-02-07: qty 1

## 2020-02-07 MED ORDER — NITROGLYCERIN 0.4 MG SL SUBL: 0.4000 mg | SUBLINGUAL_TABLET | SUBLINGUAL | 0 refills | Status: AC | PRN

## 2020-02-07 MED ORDER — DILTIAZEM HCL ER COATED BEADS 120 MG PO CP24: 120.0000 mg | ORAL_CAPSULE | Freq: Every day | ORAL | 0 refills | Status: AC

## 2020-02-07 MED ORDER — CARVEDILOL 12.5 MG PO TABS: 12.5000 mg | ORAL_TABLET | Freq: Two times a day (BID) | ORAL | Status: DC

## 2020-02-07 MED ORDER — CARVEDILOL 25 MG PO TABS
25.0000 mg | ORAL_TABLET | Freq: Two times a day (BID) | ORAL | Status: DC
  Administered 2020-02-07: 18:00:00 25 mg via ORAL
  Filled 2020-02-07: qty 1

## 2020-02-07 NOTE — Progress Notes (Addendum)
Progress Note  Patient Name: Marcus Corrigandgar Albert Dilauro Jr. Date of Encounter: 02/07/2020  Primary Cardiologist:  Tobias AlexanderKatarina Jakeya Gherardi, MD  Subjective   No more CP, no SOB  Inpatient Medications    Scheduled Meds: . allopurinol  100 mg Oral Daily  . apixaban  5 mg Oral BID  . aspirin EC  81 mg Oral Daily  . atorvastatin  40 mg Oral QPM  . carvedilol  12.5 mg Oral BID WC  . colchicine  0.6 mg Oral BID  . divalproex  250 mg Oral Daily   And  . divalproex  750 mg Oral QHS  . DULoxetine  60 mg Oral QHS  . fluticasone  2 puff Inhalation BID  . ketotifen  1 drop Both Eyes BID  . loratadine  10 mg Oral QHS  . losartan  100 mg Oral Daily  . montelukast  10 mg Oral QHS  . pantoprazole  40 mg Oral Daily  . sertraline  50 mg Oral QHS  . sodium chloride flush  3 mL Intravenous Q12H  . sodium chloride flush  3 mL Intravenous Q12H  . valACYclovir  1,000 mg Oral Daily   Continuous Infusions: . sodium chloride 50 mL/hr at 02/07/20 0033  . sodium chloride     PRN Meds: sodium chloride, acetaminophen, hydrALAZINE, ipratropium-albuterol, morphine injection, nitroGLYCERIN, ondansetron (ZOFRAN) IV, sodium chloride flush   Vital Signs    Vitals:   02/06/20 1702 02/06/20 1946 02/07/20 0020 02/07/20 0411  BP: (!) 156/94 (!) 153/97 (!) 157/114 (!) 163/103  Pulse: 84 92 91 (!) 108  Resp:  18 18 18   Temp:  98.5 F (36.9 C) 97.9 F (36.6 C) (!) 97.5 F (36.4 C)  TempSrc:   Oral Oral  SpO2: 96% 97% 97% 96%  Weight:   125.3 kg   Height:        Intake/Output Summary (Last 24 hours) at 02/07/2020 1026 Last data filed at 02/07/2020 1020 Gross per 24 hour  Intake 2541.63 ml  Output 3175 ml  Net -633.37 ml   Filed Weights   02/04/20 1935 02/07/20 0020  Weight: 127 kg 125.3 kg   Last Weight  Most recent update: 02/07/2020 12:23 AM   Weight  125.3 kg (276 lb 3.2 oz)           Weight change:   Telemetry    Atrial fib, RVR at times - Personally Reviewed  ECG    None today -  Personally Reviewed  Physical Exam   General: Well developed, well nourished, male appearing in no acute distress. Head: Normocephalic, atraumatic.  Neck: Supple without bruits, JVD 8-9 cm. Lungs:  Resp regular and unlabored, CTA. Heart: Irreg R&R, S1, S2, no S3, S4, or murmur; no rub. Abdomen: Soft, non-tender, non-distended with normoactive bowel sounds. No hepatomegaly. No rebound/guarding. No obvious abdominal masses. Extremities: No clubbing, cyanosis, no edema. Distal pedal pulses are 2+ bilaterally. R radial cath site w/out ecchymosis or hematoma Neuro: Alert and oriented X 3. Moves all extremities spontaneously. Psych: Normal affect.  Labs    Hematology Recent Labs  Lab 02/05/20 0449 02/06/20 0148 02/07/20 0338  WBC 10.6* 10.5 8.5  RBC 5.16 5.42 5.23  HGB 16.7 17.6* 17.0  HCT 49.1 51.1 47.8  MCV 95.2 94.3 91.4  MCH 32.4 32.5 32.5  MCHC 34.0 34.4 35.6  RDW 13.0 12.8 12.9  PLT 177 211 211    Chemistry Recent Labs  Lab 02/05/20 0449 02/06/20 0148 02/07/20 0338  NA 138 133* 135  K 3.9 3.6 3.8  CL 102 101 103  CO2 24 20* 22  GLUCOSE 114* 145* 147*  BUN 17 18 13   CREATININE 0.78 0.81 0.90  CALCIUM 8.9 8.8* 8.7*  PROT 6.7  --   --   ALBUMIN 3.6  --   --   AST 22  --   --   ALT 25  --   --   ALKPHOS 90  --   --   BILITOT 0.8  --   --   GFRNONAA >60 >60 >60  ANIONGAP 12 12 10      High Sensitivity Troponin:   Recent Labs  Lab 02/04/20 1956 02/04/20 2254 02/05/20 0449 02/05/20 1613 02/05/20 1905  TROPONINIHS 247* 247* 263* 133* 133*      BNP Recent Labs  Lab 02/05/20 0450  BNP 218.8*    Lab Results  Component Value Date   CHOL 131 02/05/2020   HDL 28 (L) 02/05/2020   LDLCALC 89 02/05/2020   TRIG 69 02/05/2020   CHOLHDL 4.7 02/05/2020   Lab Results  Component Value Date   ALT 25 02/05/2020   AST 22 02/05/2020   ALKPHOS 90 02/05/2020   BILITOT 0.8 02/05/2020   No results found for: HGBA1C Lab Results  Component Value Date   TSH  4.970 (H) 02/05/2020    DDimer No results for input(s): DDIMER in the last 168 hours.   Radiology    CT Head Wo Contrast  Result Date: 02/04/2020 CLINICAL DATA:  64 year old male with headache. Concern for intracranial hemorrhage. EXAM: CT HEAD WITHOUT CONTRAST TECHNIQUE: Contiguous axial images were obtained from the base of the skull through the vertex without intravenous contrast. COMPARISON:  None. FINDINGS: Brain: The ventricles and sulci appropriate size for patient's age. The gray-white matter discrimination is preserved. There is no acute intracranial hemorrhage. No mass effect midline shift no extra-axial fluid collection. Vascular: No hyperdense vessel or unexpected calcification. Skull: Normal. Negative for fracture or focal lesion. Sinuses/Orbits: Mild mucoperiosteal thickening of paranasal sinuses. No air-fluid level. The mastoid air cells are clear. Other: None IMPRESSION: Unremarkable noncontrast CT of the brain. Electronically Signed   By: 02/06/2020 M.D.   On: 02/04/2020 23:27   CT ANGIO CHEST PE W OR WO CONTRAST  Result Date: 02/05/2020 CLINICAL DATA:  Chest pain radiating to the neck and back, states that this is similar to prior episode of pericarditis, negative COVID-19, elevated troponin EXAM: CT ANGIOGRAPHY CHEST WITH CONTRAST TECHNIQUE: Multidetector CT imaging of the chest was performed using the standard protocol during bolus administration of intravenous contrast. Multiplanar CT image reconstructions and MIPs were obtained to evaluate the vascular anatomy. CONTRAST:  02/06/2020 OMNIPAQUE IOHEXOL 350 MG/ML SOLN COMPARISON:  CT 04/29/2017 FINDINGS: Cardiovascular: Satisfactory opacification the pulmonary arteries to the segmental level. No pulmonary artery filling defects are identified. Central pulmonary arteries are normal caliber. Cardiomegaly with biatrial enlargement. Three-vessel coronary artery atherosclerosis. There is only trace pericardial fluid at this time.  Atherosclerotic plaque within the normal caliber aorta. Normal 3 vessel branching of the aortic arch with calcification of the proximal great vessels. Slight reflux of contrast into the IVC. No other major venous abnormality. Mediastinum/Nodes: No mediastinal fluid or gas. Normal thyroid gland and thoracic inlet. No acute abnormality of the trachea or esophagus. No worrisome mediastinal, hilar or axillary adenopathy. Lungs/Pleura: Limited assessment given respiratory motion artifact. Low lung volumes and atelectatic changes. No focal consolidative opacity, pneumothorax or effusion. No convincing CT features of edema. Few scattered calcified granulomata. No  concerning pulmonary nodules or masses. Upper Abdomen: Some layering calcified gallstones or biliary sludge may be present within the gallbladder, incompletely visualized on this exam. 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 Musculoskeletal: Degenerative changes are present in the imaged spine and shoulders. No acute osseous abnormality or suspicious osseous lesion. No worrisome chest wall lesions. Review of the MIP images confirms the above findings. IMPRESSION: 1. No evidence of pulmonary embolism. 2. Trace pericardial effusion. Cardiomegaly with biatrial enlargement. Three-vessel coronary artery atherosclerosis. 3. Atelectatic changes and respiratory motion without other acute intrathoracic process. 4. Reflux of contrast into the IVC, can be seen with elevated right heart pressure/right heart dysfunction. 5. Cholelithiasis or biliary sludge may be present within the gallbladder, incompletely visualized on this exam. Correlate with exam findings and consider right upper quadrant ultrasound. 6. 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 7. Aortic Atherosclerosis (ICD10-I70.0). Electronically Signed   By: Kreg Shropshire M.D.   On: 02/05/2020 05:27   CARDIAC CATHETERIZATION  Result Date: 02/06/2020  LV end diastolic pressure  is mildly elevated.  There is no aortic valve stenosis.  1st Diag lesion is 60% stenosed.  Prox RCA lesion is 55% stenosed.  Mid LAD lesion is 45% stenosed.  Mid LAD to Dist LAD lesion is 40% stenosed.  SUMMARY  Moderate tenderness three-vessel disease:  50 to 60% focal proximal-mid RCA (in a band),  40 to 50% diffuse mid LAD with diffuse 50 to 60% 1st Diag.  Mildly elevated LVEDP with normal LVEF on Echo.  Suspect + Troponin due to Afib & Microvascular CAD. RECOMMENDATIONS  The patient has been [facility]  Okay to restart IV heparin 8 hours sheath removal - Can start OAC by 8 hr after sheath removal.   Continue Aggressive Risk Factor Modification  Would consider potentially using diltiazem as calcium channel blocker for rate control and microvascular antianginal benefit. Bryan Lemma, MD  DG Chest Portable 1 View  Result Date: 02/04/2020 CLINICAL DATA:  Chest pain, shortness of breath EXAM: PORTABLE CHEST 1 VIEW COMPARISON:  12/19/2017 FINDINGS: Single frontal view of the chest demonstrates an enlarged cardiac silhouette. No acute airspace disease, effusion, or pneumothorax. No acute bony abnormalities. IMPRESSION: 1. Enlarged cardiac silhouette.  No acute airspace disease. Electronically Signed   By: Sharlet Salina M.D.   On: 02/04/2020 21:25   ECHOCARDIOGRAM COMPLETE  Result Date: 02/05/2020    ECHOCARDIOGRAM REPORT   Patient Name:   Lashan Gluth. Date of Exam: 02/05/2020 Medical Rec #:  [id number]                Height:       69.0 in Accession #:    [id number]               Weight:       280.0 lb Date of Birth:  August 29, 1955                BSA:          2.384 m Patient Age:    64 years                 BP:           151/96 mmHg Patient Gender: M                        HR:           72 bpm. Exam Location:  Inpatient Procedure:  2D Echo, Cardiac Doppler and Color Doppler Indications:    I31.3 Pericardial effusion (noninflammatory)  History:        Patient has  no prior history of Echocardiogram examinations.                 Abnormal ECG, Signs/Symptoms:Chest Pain; Risk                 Factors:Hypertension and Sleep Apnea.  Sonographer:    Sheralyn Boatman RDCS Referring Phys: [id number] Deno Lunger [facility]  Sonographer Comments: Technically difficult study due to poor echo windows and patient is morbidly obese. Image acquisition challenging due to patient body habitus. IMPRESSIONS  1. Left ventricular ejection fraction, by estimation, is 60 to 65%. The left ventricle has normal function. The left ventricle has no regional wall motion abnormalities. There is moderate left ventricular hypertrophy. Left ventricular diastolic function  could not be evaluated.  2. Right ventricular systolic function is normal. The right ventricular size is normal. Tricuspid regurgitation signal is inadequate for assessing PA pressure.  3. The mitral valve is abnormal. Mild mitral valve regurgitation.  4. The aortic valve is tricuspid. Aortic valve regurgitation is not visualized.  5. Aortic dilatation noted. There is mild dilatation of the ascending aorta, measuring 42 mm.  6. The inferior vena cava is normal in size with greater than 50% respiratory variability, suggesting right atrial pressure of 3 mmHg.  7. No significant pericardial effusion. RV apical fat pad noted. Comparison(s): No prior Echocardiogram. FINDINGS  Left Ventricle: Left ventricular ejection fraction, by estimation, is 60 to 65%. The left ventricle has normal function. The left ventricle has no regional wall motion abnormalities. The left ventricular internal cavity size was normal in size. There is  moderate left ventricular hypertrophy. Left ventricular diastolic function could not be evaluated due to atrial fibrillation. Left ventricular diastolic function could not be evaluated. Right Ventricle: The right ventricular size is normal. No increase in right ventricular wall thickness. Right ventricular systolic function is normal.  Tricuspid regurgitation signal is inadequate for assessing PA pressure. Left Atrium: Left atrial size was normal in size. Right Atrium: Right atrial size was normal in size. Pericardium: There is no evidence of pericardial effusion. The pericardial effusion is circumferential. Presence of pericardial fat pad. Mitral Valve: The mitral valve is abnormal. There is mild thickening of the mitral valve leaflet(s). Mild mitral valve regurgitation, with posteriorly-directed jet. Tricuspid Valve: The tricuspid valve is grossly normal. Tricuspid valve regurgitation is trivial. Aortic Valve: The aortic valve is tricuspid. Aortic valve regurgitation is not visualized. Pulmonic Valve: The pulmonic valve was grossly normal. Pulmonic valve regurgitation is trivial. Aorta: Aortic dilatation noted. There is mild dilatation of the ascending aorta, measuring 42 mm. Venous: The inferior vena cava is normal in size with greater than 50% respiratory variability, suggesting right atrial pressure of 3 mmHg. IAS/Shunts: No atrial level shunt detected by color flow Doppler.  LEFT VENTRICLE PLAX 2D LVIDd:         4.80 cm LVIDs:         3.10 cm LV PW:         1.65 cm LV IVS:        1.45 cm LVOT diam:     2.20 cm LV SV:         51 LV SV Index:   22 LVOT Area:     3.80 cm  LV Volumes (MOD) LV vol d, MOD A2C: 110.0 ml LV vol d, MOD A4C: 113.0 ml LV  vol s, MOD A2C: 46.7 ml LV vol s, MOD A4C: 52.2 ml LV SV MOD A2C:     63.3 ml LV SV MOD A4C:     113.0 ml LV SV MOD BP:      63.0 ml RIGHT VENTRICLE             IVC RV S prime:     12.50 cm/s  IVC diam: 1.30 cm TAPSE (M-mode): 1.6 cm LEFT ATRIUM             Index       RIGHT ATRIUM           Index LA diam:        4.80 cm 2.01 cm/m  RA Area:     16.00 cm LA Vol (A2C):   63.2 ml 26.51 ml/m RA Volume:   43.20 ml  18.12 ml/m LA Vol (A4C):   76.1 ml 31.93 ml/m LA Biplane Vol: 72.6 ml 30.46 ml/m  AORTIC VALVE             PULMONIC VALVE LVOT Vmax:   75.40 cm/s  PR End Diast Vel: 1.92 msec LVOT Vmean:   55.500 cm/s LVOT VTI:    0.135 m  AORTA Ao Root diam: 3.60 cm Ao Asc diam:  4.00 cm MITRAL VALVE MV Area (PHT): 6.17 cm      SHUNTS MV Decel Time: 123 msec      Systemic VTI:  0.14 m MR Peak grad:    124.5 mmHg  Systemic Diam: 2.20 cm MR Mean grad:    90.0 mmHg MR Vmax:         558.00 cm/s MR Vmean:        454.0 cm/s MR PISA:         2.26 cm MR PISA Eff ROA: 14 mm MR PISA Radius:  0.60 cm MV E velocity: 126.00 cm/s Zoila Shutter MD Electronically signed by Zoila Shutter MD Signature Date/Time: 02/05/2020/11:11:32 AM    Final     Cardiac Studies   CARDIAC CATH: 02/06/2020  LV end diastolic pressure is mildly elevated.  There is no aortic valve stenosis.  1st Diag lesion is 60% stenosed.  Prox RCA lesion is 55% stenosed.  Mid LAD lesion is 45% stenosed.  Mid LAD to Dist LAD lesion is 40% stenosed.   SUMMARY  Moderate tenderness three-vessel disease:  ? 50 to 60% focal proximal-mid RCA (in a band),  ? 40 to 50% diffuse mid LAD with diffuse 50 to 60% 1st Diag.   Mildly elevated LVEDP with normal LVEF on Echo.  Suspect + Troponin due to Afib & Microvascular CAD.   RECOMMENDATIONS  The patient has been transferred to [facility]  Okay to restart IV heparin 8 hours sheath removal - Can start OAC by 8 hr after sheath removal.    Continue Aggressive Risk Factor Modification  Would consider potentially using diltiazem as calcium channel blocker for rate control and microvascular antianginal benefit.  ECHO:  02/05/2020 1. Left ventricular ejection fraction, by estimation, is 60 to 65%. The  left ventricle has normal function. The left ventricle has no regional  wall motion abnormalities. There is moderate left ventricular hypertrophy.  Left ventricular diastolic function  could not be evaluated.  2. Right ventricular systolic function is normal. The right ventricular  size is normal. Tricuspid regurgitation signal is inadequate for assessing  PA pressure.  3. The  mitral valve is abnormal. Mild mitral valve regurgitation.  4. The aortic valve  is tricuspid. Aortic valve regurgitation is not  visualized.  5. Aortic dilatation noted. There is mild dilatation of the ascending  aorta, measuring 42 mm.  6. The inferior vena cava is normal in size with greater than 50%  respiratory variability, suggesting right atrial pressure of 3 mmHg.  7. No significant pericardial effusion. RV apical fat pad noted.   Patient Profile     64 y.o. male w/ hx paroxysmal atrial fibrillation dx 04/2017 s/p DCCV to NSR with no recurrence>>no longer on Eliquis or flecainide, HTN, HLD, hx of pericardial effusion with pericarditis s/p large-volume pericardiocentesis 04/2017 and asthma who was admitted 12/21 for chest pain.  Assessment & Plan    1. Chest pain w/ mild trop elevation - s/p cath, results above, mod CAD >> med rx - trop elevation felt 2nd combo of CAD, AF RVR - pt feels well today - needs aggressive CRF control  2. Persistent A fib, RVR - pta on propranolol 40 mg bid >> Cioreg 12.5 mg bid - since EF is normal, discuss changing BB to metoprolol w/ MD - Dr Herbie Baltimore recommends Cardizem for rate control and anti-anginal effect, will start CD 120 mg qd - CHA2DS2-VASc = 4   (age x 1, CAD, HTN) - has been started on Eliquis - as long as he is asymptomatic, could do DCCV in 3 weeks  3. HTN - BP is poorly controlled most of the time - continue BB, see above - add Cardizem - was on losartan 50 mg qd pta, not on now, see if he needs on the Cardizem  4. Gallbladder sludge, elevated TSH - seen on CT, discuss abd [place] w/ MD   - f/u TSH w/ PCP as outpt.  Otherwise, per IM Principal Problem:   Chest pain Active Problems:   AF (paroxysmal atrial fibrillation) (HCC)   Mixed hyperlipidemia   OSA on CPAP   Essential hypertension   GERD without esophagitis   NSTEMI (non-ST elevated myocardial infarction) (HCC)  Signed, Theodore Demark , PA-C 10:26  AM 02/07/2020 Pager: [phone]  The patient was seen, examined and discussed with Theodore Demark, PA-C and I agree with the above.   This patient's cath showed moderate nonobstructive disease, we will continue medical management with aspirin, atorvastatin 40 mg daily, I will increase carvedilol to 25 mg p.o. twice daily given CAD and A. fib with relatively high heart rate in 80s and 90s. Regarding blood pressure we are increasing carvedilol, I will continue losartan . He was educated about importance of low-sodium diet. He had atrial fibrillation in the past, he has recured now, his CHA2DS2-VASc score is 2, he has been started on Eliquis, we will arrange for follow-up in our clinic and if he remains in A. fib we will arrange outpatient cardioversion in 4 weeks.  The patient can be discharged today.  [facility] will sign off.   Medication Recommendations:  As above Other recommendations (labs, testing, etc): No further testing Follow up as an outpatient: We will arrange follow-up  Tobias Alexander, MD 02/07/2020

## 2020-02-07 NOTE — Progress Notes (Signed)
PROGRESS NOTE    Marcus Leonard.  OXB:[id number] DOB: August 31, 1955 DOA: 02/04/2020 PCP: Lester [place]., MD   Brief Narrative:  Marcus Leonardis a 64 y.o.malewith a hx of paroxysmalatrial fibrillation dx 04/2017 s/p DCCV to NSR with no recurrence>>no longer on Eliquis or flecainide, HTN, HLD, hx of pericardial effusion with pericarditis s/plarge-volume pericardiocentesis3/2019 and asthmawho is being seen today for the evaluation ofchest pain  Assessment & Plan:   NSTEMI: -Patient presented with chest pain with mildly elevated troponin. CTA negative for acute PE.  -Status post left heart cath and coronary angiography on 02/06/2020 which showed moderate three-vessel disease. 50 to 60% focal proximal-mid RCA, 40 to 50% diffuse mid LAD and diffuse 50 to 60% first diagonal. Suspect positive troponin due to A. fib and microvascular coronary artery disease. Recommend aggressive risk factor modification and cannot start oral anticoagulation by 8 hours after sheath removal. -Currently on IV heparin-we will switch to p.o. Eliquis -Monitor vitals closely. -Appreciate cardiology assistance. -Continue aspirin, statin, Coreg, losartan  Paroxysmal A. Fib: -Status post cardioversion-he was taken off anticoagulation and flecainide on 3/21 per cardiology. -On IV heparin-we will switch to Eliquis. Continue Coreg. Monitor closely on telemetry. -He is tachycardic and blood pressure is elevated. We will go up on his Coreg dose from 6.25-12.5  Hypertension: Blood pressure elevated this morning -Continue home meds losartan. Increase Coreg dose from 6.25 twice daily to 12.5 twice daily -Continue hydralazine as needed for blood pressure more than 180/110. -Monitor blood pressure closely  GERD: Continue PPI  Depression/anxiety: Continue Zoloft, Depakote  Hyperlipidemia: Continue statin  Gout: Continue allopurinol, colchicine  OSA: On CPAP  Morbid obesity with BMI of 40: Diet  modification/exercise and weight loss recommended  DVT prophylaxis: Heparin Code Status: Full code Family Communication:  None present at bedside.  Plan of care discussed with patient in length and he verbalized understanding and agreed with it. Disposition Plan: Likely home tomorrow  Consultants:  Cardiology Procedures:  Left heart cath Antimicrobials:   None  Status is: Inpatient  Remains inpatient appropriate because:Ongoing diagnostic testing needed not appropriate for outpatient work up   Dispo:  Patient From: Home  Planned Disposition: Home  Expected discharge date: 02/08/2020  medically stable for discharge: No          Subjective: Patient seen and examined. Resting comfortably on the bed. Tells me that he had severe headache this morning for which he received morphine. Tells me that his shortness of breath and chest pressure has improved. Denies nausea, vomiting, palpitation, leg swelling, orthopnea or PND.  Objective: Vitals:   02/06/20 1702 02/06/20 1946 02/07/20 0020 02/07/20 0411  BP: (!) 156/94 (!) 153/97 (!) 157/114 (!) 163/103  Pulse: 84 92 91 (!) 108  Resp:  18 18 18   Temp:  98.5 F (36.9 C) 97.9 F (36.6 C) (!) 97.5 F (36.4 C)  TempSrc:   Oral Oral  SpO2: 96% 97% 97% 96%  Weight:   125.3 kg   Height:        Intake/Output Summary (Last 24 hours) at 02/07/2020 0944 Last data filed at 02/07/2020 0834 Gross per 24 hour  Intake 2064.63 ml  Output 2825 ml  Net -760.37 ml   Filed Weights   02/04/20 1935 02/07/20 0020  Weight: 127 kg 125.3 kg    Examination:  General exam: Appears calm and comfortable, on room air, communicating well, obese Respiratory system: Clear to auscultation. Respiratory effort normal. Cardiovascular system: S1 & S2 heard, RRR. No JVD,  murmurs, rubs, gallops or clicks. No pedal edema. Gastrointestinal system: Abdomen is nondistended, soft and nontender. No organomegaly or masses felt. Normal bowel sounds  heard. Central nervous system: Alert and oriented. No focal neurological deficits. Extremities: Symmetric 5 x 5 power. Skin: No rashes, lesions or ulcers Psychiatry: Judgement and insight appear normal. Mood & affect appropriate.    Data Reviewed: I have personally reviewed following labs and imaging studies  CBC: Recent Labs  Lab 02/04/20 1956 02/05/20 0449 02/06/20 0148 02/07/20 0338  WBC 12.2* 10.6* 10.5 8.5  HGB 17.0 16.7 17.6* 17.0  HCT 50.4 49.1 51.1 47.8  MCV 96.2 95.2 94.3 91.4  PLT 185 177 211 211   Basic Metabolic Panel: Recent Labs  Lab 02/04/20 1956 02/05/20 0449 02/06/20 0148 02/07/20 0338  NA 137 138 133* 135  K 4.2 3.9 3.6 3.8  CL 103 102 101 103  CO2 25 24 20* 22  GLUCOSE 122* 114* 145* 147*  BUN 18 17 18 13   CREATININE 0.85 0.78 0.81 0.90  CALCIUM 9.1 8.9 8.8* 8.7*  MG  --  2.1 2.1 2.0   GFR: Estimated Creatinine Clearance: 107.1 mL/min (by C-G formula based on SCr of 0.9 mg/dL). Liver Function Tests: Recent Labs  Lab 02/05/20 0449  AST 22  ALT 25  ALKPHOS 90  BILITOT 0.8  PROT 6.7  ALBUMIN 3.6   No results for input(s): LIPASE, AMYLASE in the last 168 hours. No results for input(s): AMMONIA in the last 168 hours. Coagulation Profile: No results for input(s): INR, PROTIME in the last 168 hours. Cardiac Enzymes: No results for input(s): CKTOTAL, CKMB, CKMBINDEX, TROPONINI in the last 168 hours. BNP (last 3 results) No results for input(s): PROBNP in the last 8760 hours. HbA1C: No results for input(s): HGBA1C in the last 72 hours. CBG: No results for input(s): GLUCAP in the last 168 hours. Lipid Profile: Recent Labs    02/05/20 0450  CHOL 131  HDL 28*  LDLCALC 89  TRIG 69  CHOLHDL 4.7   Thyroid Function Tests: Recent Labs    02/05/20 0532  TSH 4.970*   Anemia Panel: No results for input(s): VITAMINB12, FOLATE, FERRITIN, TIBC, IRON, RETICCTPCT in the last 72 hours. Sepsis Labs: No results for input(s): PROCALCITON,  LATICACIDVEN in the last 168 hours.  Recent Results (from the past 240 hour(s))  Resp Panel by RT-PCR (Flu A&B, Covid) Nasopharyngeal Swab     Status: None   Collection Time: 02/04/20 10:57 PM   Specimen: Nasopharyngeal Swab; Nasopharyngeal(NP) swabs in vial transport medium  Result Value Ref Range Status   SARS Coronavirus 2 by RT PCR NEGATIVE NEGATIVE Final    Comment: (NOTE) SARS-CoV-2 target nucleic acids are NOT DETECTED.  The SARS-CoV-2 RNA is generally detectable in upper respiratory specimens during the acute phase of infection. The lowest concentration of SARS-CoV-2 viral copies this assay can detect is 138 copies/mL. A negative result does not preclude SARS-Cov-2 infection and should not be used as the sole basis for treatment or other patient management decisions. A negative result may occur with  improper specimen collection/handling, submission of specimen other than nasopharyngeal swab, presence of viral mutation(s) within the areas targeted by this assay, and inadequate number of viral copies(<138 copies/mL). A negative result must be combined with clinical observations, patient history, and epidemiological information. The expected result is Negative.  Fact Sheet for Patients:  BloggerCourse.comhttps://www.fda.gov/media/152166/download  Fact Sheet for Healthcare Providers:  SeriousBroker.ithttps://www.fda.gov/media/152162/download  This test is no t yet approved or cleared by the [place] FDA and  has been authorized for detection and/or diagnosis of SARS-CoV-2 by FDA under an Emergency Use Authorization (EUA). This EUA will remain  in effect (meaning this test can be used) for the duration of the COVID-19 declaration under Section 564(b)(1) of the Act, 21 U.S.C.section 360bbb-3(b)(1), unless the authorization is terminated  or revoked sooner.       Influenza A by PCR NEGATIVE NEGATIVE Final   Influenza B by PCR NEGATIVE NEGATIVE Final    Comment: (NOTE) The Xpert Xpress  SARS-CoV-2/FLU/RSV plus assay is intended as an aid in the diagnosis of influenza from Nasopharyngeal swab specimens and should not be used as a sole basis for treatment. Nasal washings and aspirates are unacceptable for Xpert Xpress SARS-CoV-2/FLU/RSV testing.  Fact Sheet for Patients: BloggerCourse.com  Fact Sheet for Healthcare Providers: SeriousBroker.it  This test is not yet approved or cleared by the [place] FDA and has been authorized for detection and/or diagnosis of SARS-CoV-2 by FDA under an Emergency Use Authorization (EUA). This EUA will remain in effect (meaning this test can be used) for the duration of the COVID-19 declaration under Section 564(b)(1) of the Act, 21 U.S.C. section 360bbb-3(b)(1), unless the authorization is terminated or revoked.  Performed at [facility], [address] [place], [place] [address]       Radiology Studies: CARDIAC CATHETERIZATION  Result Date: 02/06/2020  LV end diastolic pressure is mildly elevated.  There is no aortic valve stenosis.  1st Diag lesion is 60% stenosed.  Prox RCA lesion is 55% stenosed.  Mid LAD lesion is 45% stenosed.  Mid LAD to Dist LAD lesion is 40% stenosed.  SUMMARY  Moderate tenderness three-vessel disease:  50 to 60% focal proximal-mid RCA (in a band),  40 to 50% diffuse mid LAD with diffuse 50 to 60% 1st Diag.  Mildly elevated LVEDP with normal LVEF on Echo.  Suspect + Troponin due to Afib & Microvascular CAD. RECOMMENDATIONS  The patient has been transferred to [facility]  Okay to restart IV heparin 8 hours sheath removal - Can start OAC by 8 hr after sheath removal.   Continue Aggressive Risk Factor Modification  Would consider potentially using diltiazem as calcium channel blocker for rate control and microvascular antianginal benefit. Bryan Lemma, MD  ECHOCARDIOGRAM COMPLETE  Result Date: 02/05/2020     ECHOCARDIOGRAM REPORT   Patient Name:   Marcus Leonard. Date of Exam: 02/05/2020 Medical Rec #:  [id number]                Height:       69.0 in Accession #:    [id number]               Weight:       280.0 lb Date of Birth:  03-22-1955                BSA:          2.384 m Patient Age:    64 years                 BP:           151/96 mmHg Patient Gender: M                        HR:           72 bpm. Exam Location:  Inpatient Procedure: 2D Echo, Cardiac Doppler and Color Doppler Indications:  I31.3 Pericardial effusion (noninflammatory)  History:        Patient has no prior history of Echocardiogram examinations.                 Abnormal ECG, Signs/Symptoms:Chest Pain; Risk                 Factors:Hypertension and Sleep Apnea.  Sonographer:    Sheralyn Boatman RDCS Referring Phys: [id number] Deno Lunger [facility]  Sonographer Comments: Technically difficult study due to poor echo windows and patient is morbidly obese. Image acquisition challenging due to patient body habitus. IMPRESSIONS  1. Left ventricular ejection fraction, by estimation, is 60 to 65%. The left ventricle has normal function. The left ventricle has no regional wall motion abnormalities. There is moderate left ventricular hypertrophy. Left ventricular diastolic function  could not be evaluated.  2. Right ventricular systolic function is normal. The right ventricular size is normal. Tricuspid regurgitation signal is inadequate for assessing PA pressure.  3. The mitral valve is abnormal. Mild mitral valve regurgitation.  4. The aortic valve is tricuspid. Aortic valve regurgitation is not visualized.  5. Aortic dilatation noted. There is mild dilatation of the ascending aorta, measuring 42 mm.  6. The inferior vena cava is normal in size with greater than 50% respiratory variability, suggesting right atrial pressure of 3 mmHg.  7. No significant pericardial effusion. RV apical fat pad noted. Comparison(s): No prior Echocardiogram. FINDINGS  Left Ventricle:  Left ventricular ejection fraction, by estimation, is 60 to 65%. The left ventricle has normal function. The left ventricle has no regional wall motion abnormalities. The left ventricular internal cavity size was normal in size. There is  moderate left ventricular hypertrophy. Left ventricular diastolic function could not be evaluated due to atrial fibrillation. Left ventricular diastolic function could not be evaluated. Right Ventricle: The right ventricular size is normal. No increase in right ventricular wall thickness. Right ventricular systolic function is normal. Tricuspid regurgitation signal is inadequate for assessing PA pressure. Left Atrium: Left atrial size was normal in size. Right Atrium: Right atrial size was normal in size. Pericardium: There is no evidence of pericardial effusion. The pericardial effusion is circumferential. Presence of pericardial fat pad. Mitral Valve: The mitral valve is abnormal. There is mild thickening of the mitral valve leaflet(s). Mild mitral valve regurgitation, with posteriorly-directed jet. Tricuspid Valve: The tricuspid valve is grossly normal. Tricuspid valve regurgitation is trivial. Aortic Valve: The aortic valve is tricuspid. Aortic valve regurgitation is not visualized. Pulmonic Valve: The pulmonic valve was grossly normal. Pulmonic valve regurgitation is trivial. Aorta: Aortic dilatation noted. There is mild dilatation of the ascending aorta, measuring 42 mm. Venous: The inferior vena cava is normal in size with greater than 50% respiratory variability, suggesting right atrial pressure of 3 mmHg. IAS/Shunts: No atrial level shunt detected by color flow Doppler.  LEFT VENTRICLE PLAX 2D LVIDd:         4.80 cm LVIDs:         3.10 cm LV PW:         1.65 cm LV IVS:        1.45 cm LVOT diam:     2.20 cm LV SV:         51 LV SV Index:   22 LVOT Area:     3.80 cm  LV Volumes (MOD) LV vol d, MOD A2C: 110.0 ml LV vol d, MOD A4C: 113.0 ml LV vol s, MOD A2C: 46.7 ml LV vol  s, MOD A4C:  52.2 ml LV SV MOD A2C:     63.3 ml LV SV MOD A4C:     113.0 ml LV SV MOD BP:      63.0 ml RIGHT VENTRICLE             IVC RV S prime:     12.50 cm/s  IVC diam: 1.30 cm TAPSE (M-mode): 1.6 cm LEFT ATRIUM             Index       RIGHT ATRIUM           Index LA diam:        4.80 cm 2.01 cm/m  RA Area:     16.00 cm LA Vol (A2C):   63.2 ml 26.51 ml/m RA Volume:   43.20 ml  18.12 ml/m LA Vol (A4C):   76.1 ml 31.93 ml/m LA Biplane Vol: 72.6 ml 30.46 ml/m  AORTIC VALVE             PULMONIC VALVE LVOT Vmax:   75.40 cm/s  PR End Diast Vel: 1.92 msec LVOT Vmean:  55.500 cm/s LVOT VTI:    0.135 m  AORTA Ao Root diam: 3.60 cm Ao Asc diam:  4.00 cm MITRAL VALVE MV Area (PHT): 6.17 cm      SHUNTS MV Decel Time: 123 msec      Systemic VTI:  0.14 m MR Peak grad:    124.5 mmHg  Systemic Diam: 2.20 cm MR Mean grad:    90.0 mmHg MR Vmax:         558.00 cm/s MR Vmean:        454.0 cm/s MR PISA:         2.26 cm MR PISA Eff ROA: 14 mm MR PISA Radius:  0.60 cm MV E velocity: 126.00 cm/s Zoila Shutter MD Electronically signed by Zoila Shutter MD Signature Date/Time: 02/05/2020/11:11:32 AM    Final     Scheduled Meds: . allopurinol  100 mg Oral Daily  . aspirin EC  81 mg Oral Daily  . atorvastatin  40 mg Oral QPM  . carvedilol  6.25 mg Oral BID WC  . colchicine  0.6 mg Oral BID  . divalproex  250 mg Oral Daily   And  . divalproex  750 mg Oral QHS  . DULoxetine  60 mg Oral QHS  . fluticasone  2 puff Inhalation BID  . ketotifen  1 drop Both Eyes BID  . loratadine  10 mg Oral QHS  . losartan  100 mg Oral Daily  . montelukast  10 mg Oral QHS  . pantoprazole  40 mg Oral Daily  . sertraline  50 mg Oral QHS  . sodium chloride flush  3 mL Intravenous Q12H  . sodium chloride flush  3 mL Intravenous Q12H  . valACYclovir  1,000 mg Oral Daily   Continuous Infusions: . sodium chloride 50 mL/hr at 02/07/20 0033  . sodium chloride    . heparin 1,950 Units/hr (02/07/20 0939)     LOS: 2 days   Time spent:  35 minutes   Samia Kukla Estill Cotta, MD [facility]  If 7PM-7AM, please contact night-coverage [email or web address] 02/07/2020, 9:44 AM

## 2020-02-07 NOTE — Progress Notes (Addendum)
ANTICOAGULATION CONSULT NOTE   Pharmacy Consult for Heparin Indication: afib  Allergies  Allergen Reactions  . Benzonatate Shortness Of Breath  . Codeine     Other reaction(s): GI Upset (intolerance), Other (See Comments) Over sedates me Over sedates me Constipation    . Hydrochlorothiazide Anaphylaxis, Photosensitivity and Hives    Stomach issues    . Furosemide     Other reaction(s): Red Man Syndrome (ALLERGY)    Patient Measurements: Height: 5\' 9"  (175.3 cm) Weight: 125.3 kg (276 lb 3.2 oz) (scale a) IBW/kg (Calculated) : 70.7 HEPARIN DW (KG): 100   Vital Signs: Temp: 97.5 F (36.4 C) (12/23 0411) Temp Source: Oral (12/23 0411) BP: 163/103 (12/23 0411) Pulse Rate: 108 (12/23 0411)  Labs: Recent Labs    02/05/20 0449 02/05/20 0532 02/05/20 1613 02/05/20 1905 02/06/20 0148 02/07/20 0338 02/07/20 0729  HGB 16.7  --   --   --  17.6* 17.0  --   HCT 49.1  --   --   --  51.1 47.8  --   PLT 177  --   --   --  211 211  --   HEPARINUNFRC  --    < > 0.27*  --  0.47  --  0.16*  CREATININE 0.78  --   --   --  0.81 0.90  --   TROPONINIHS 263*  --  133* 133*  --   --   --    < > = values in this interval not displayed.    Estimated Creatinine Clearance: 107.1 mL/min (by C-G formula based on SCr of 0.9 mg/dL).   Medical History: Past Medical History:  Diagnosis Date  . Asthma   . GERD (gastroesophageal reflux disease)   . Hyperlipidemia   . Hypertension   . Paroxysmal atrial fibrillation (HCC)   . Pericarditis   . Pericarditis   . Pleural effusion   . Sleep apnea     Medications:  Infusions:  . sodium chloride 50 mL/hr at 02/07/20 0033  . sodium chloride    . heparin 1,850 Units/hr (02/07/20 0033)   Assessment: 64 yo M presents with chest pain.  Troponin elevated.  CBC WNL, no bleeding noted.   Previously on Eliquis + flecanide for Afib which he stopped in March 2021.   Chest CT negative for PE  Heparin gtt resumed at previously therapeutic rate  of 1850 units/hr s/p sheath removal yesterday, heparin level this AM subtherapeutic, no infusion issues and rate confirmed.  Goal of Therapy:  Heparin level 0.3-0.7 units/ml Monitor platelets by anticoagulation protocol: Yes   Plan:  Increase heparin gtt to 1950 units/hr F/u 6 hour heparin level F/u cards plan to transition to Eliquis  Addendum: Now to switch to Eliquis D/c heparin gtt Start Eliquis 5mg  PO BID   April 2021, PharmD Clinical Pharmacist Please check AMION for all [facility] numbers 02/07/2020 9:11 AM

## 2020-02-07 NOTE — Discharge Instructions (Signed)

## 2020-02-07 NOTE — Discharge Summary (Signed)
Physician Discharge Summary  Marcus Leonard. ZOX:[id number] DOB: [birth year] DOA: 02/04/2020  PCP: Lester Malcolm., MD  Admit date: 02/04/2020 Discharge date: 02/07/2020  Admitted From: Home Disposition: Home  Recommendations for Outpatient Follow-up:  1. Follow up with PCP in 1-2 weeks 2. Please obtain BMP/CBC in one week 3. Follow-up with cardiology in 4 weeks for cardioversion  Home Health: None Equipment/Devices: None Discharge Condition: Stable CODE STATUS: Full code Diet recommendation: Heart healthy diet  Brief/Interim Summary: Marcus Leonardis a 64 y.o.malewith a hx of paroxysmalatrial fibrillation dx 04/2017 s/p DCCV to NSR with no recurrence>>no longer on Eliquis or flecainide, HTN, HLD, hx of pericardial effusion with pericarditis s/plarge-volume pericardiocentesis3/2019 and asthmawho is being seen today for the evaluation ofchest pain  NSTEMI: -Patient presented with chest pain with mildly elevated troponin. CTA negative for acute PE.  -Status post left heart cath and coronary angiography on 02/06/2020 which showed moderate three-vessel disease. 50 to 60% focal proximal-mid RCA, 40 to 50% diffuse mid LAD and diffuse 50 to 60% first diagonal. Suspect positive troponin due to A. fib and microvascular coronary artery disease. Recommend aggressive risk factor modification. -Appreciate cardiology assistance-okay to discharge from cardiology standpoint. -Continued aspirin, statin, Coreg, losartan, Nitro PRN for chest pain at discharge  Paroxysmal A. Fib: -Status post cardioversion-he was taken off anticoagulation and flecainide on 3/21 per cardiology. -Coreg dose increased to 25 mg twice daily, added Cardizem 120 mg daily.  Started on Eliquis 5 mg twice daily.  Follow-up with cardiology outpatient in 4 weeks for outpatient cardioversion.  Hypertension:  -Patient will be discharged home on losartan 100 mg daily, Coreg 25 mg twice daily, diltiazem  120 mg daily as per cardiology recommendations.    GERD: Continued PPI  Depression/anxiety: Continued Zoloft, Depakote  Hyperlipidemia: Continued statin  Gout: Continued allopurinol, colchicine  OSA: On CPAP  Morbid obesity with BMI of 40: Diet modification/exercise and weight loss recommended  Discharge Diagnoses:  NSTEMI Paroxysmal A. fib Hypertension GERD Depression/anxiety Hyperlipidemia Gout OSA on CPAP Morbid obesity with BMI of four zero   Discharge Instructions  Discharge Instructions    Diet - low sodium heart healthy   Complete by: As directed    Discharge instructions   Complete by: As directed    Follow-up with PCP in 1 week Follow-up with cardiology in 3 to 4 weeks for cardioversion Repeat CBC and BMP on follow-up visit.   Increase activity slowly   Complete by: As directed      Allergies as of 02/07/2020      Reactions   Benzonatate Shortness Of Breath   Codeine    Other reaction(s): GI Upset (intolerance), Other (See Comments) Over sedates me Over sedates me Constipation    Hydrochlorothiazide Anaphylaxis, Photosensitivity, Hives   Stomach issues    Furosemide    Other reaction(s): Red Man Syndrome (ALLERGY)      Medication List    STOP taking these medications   propranolol 40 MG tablet Commonly known as: INDERAL     TAKE these medications   albuterol 108 (90 Base) MCG/ACT inhaler Commonly known as: VENTOLIN HFA Inhale 2 puffs into the lungs every 4 (four) hours as needed for wheezing or shortness of breath.   allopurinol 100 MG tablet Commonly known as: ZYLOPRIM Take 100 mg by mouth daily.   apixaban 5 MG Tabs tablet Commonly known as: ELIQUIS Take 1 tablet (5 mg total) by mouth 2 (two) times daily.   aspirin 81 MG EC tablet Take 81  mg by mouth daily.   atorvastatin 40 MG tablet Commonly known as: LIPITOR Take 40 mg by mouth daily.   carvedilol 25 MG tablet Commonly known as: COREG Take 1 tablet (25 mg total) by  mouth 2 (two) times daily with a meal.   cetirizine 10 MG tablet Commonly known as: ZYRTEC Take 10 mg by mouth at bedtime.   colchicine 0.6 MG tablet Take 0.6 mg by mouth 2 (two) times daily.   diltiazem 120 MG 24 hr capsule Commonly known as: CARDIZEM CD Take 1 capsule (120 mg total) by mouth daily. Start taking on: February 08, 2020   divalproex 250 MG DR tablet Commonly known as: DEPAKOTE Take 250-750 mg by mouth See admin instructions. 250 MG (1 tab) in the morning and 750 MG (3 tabs) in the evening.   DULoxetine 60 MG capsule Commonly known as: CYMBALTA Take 60 mg by mouth at bedtime.   esomeprazole 40 MG capsule Commonly known as: NEXIUM Take 40 mg by mouth daily.   Flovent HFA 220 MCG/ACT inhaler Generic drug: fluticasone Inhale 2 puffs into the lungs 2 (two) times daily.   ketotifen 0.025 % ophthalmic solution Commonly known as: ZADITOR Place 1 drop into both eyes 2 (two) times daily.   losartan 100 MG tablet Commonly known as: COZAAR Take 1 tablet (100 mg total) by mouth daily. Start taking on: February 08, 2020 What changed:   medication strength  how much to take   montelukast 10 MG tablet Commonly known as: SINGULAIR Take 10 mg by mouth daily.   nitroGLYCERIN 0.4 MG SL tablet Commonly known as: NITROSTAT Place 1 tablet (0.4 mg total) under the tongue every 5 (five) minutes as needed for chest pain.   PRESERVISION AREDS 2 PO Take 1 tablet by mouth in the morning and at bedtime.   sertraline 50 MG tablet Commonly known as: ZOLOFT Take 50 mg by mouth daily.   valACYclovir 1000 MG tablet Commonly known as: VALTREX Take 1,000 mg by mouth daily.       Allergies  Allergen Reactions  . Benzonatate Shortness Of Breath  . Codeine     Other reaction(s): GI Upset (intolerance), Other (See Comments) Over sedates me Over sedates me Constipation    . Hydrochlorothiazide Anaphylaxis, Photosensitivity and Hives    Stomach issues    . Furosemide      Other reaction(s): Red Man Syndrome (ALLERGY)    Consultations:  cardiology   Procedures/Studies: CT Head Wo Contrast  Result Date: 02/04/2020 CLINICAL DATA:  64 year old male with headache. Concern for intracranial hemorrhage. EXAM: CT HEAD WITHOUT CONTRAST TECHNIQUE: Contiguous axial images were obtained from the base of the skull through the vertex without intravenous contrast. COMPARISON:  None. FINDINGS: Brain: The ventricles and sulci appropriate size for patient's age. The gray-white matter discrimination is preserved. There is no acute intracranial hemorrhage. No mass effect midline shift no extra-axial fluid collection. Vascular: No hyperdense vessel or unexpected calcification. Skull: Normal. Negative for fracture or focal lesion. Sinuses/Orbits: Mild mucoperiosteal thickening of paranasal sinuses. No air-fluid level. The mastoid air cells are clear. Other: None IMPRESSION: Unremarkable noncontrast CT of the brain. Electronically Signed   By: Elgie Collard M.D.   On: 02/04/2020 23:27   CT ANGIO CHEST PE W OR WO CONTRAST  Result Date: 02/05/2020 CLINICAL DATA:  Chest pain radiating to the neck and back, states that this is similar to prior episode of pericarditis, negative COVID-19, elevated troponin EXAM: CT ANGIOGRAPHY CHEST WITH CONTRAST TECHNIQUE: Multidetector CT  imaging of the chest was performed using the standard protocol during bolus administration of intravenous contrast. Multiplanar CT image reconstructions and MIPs were obtained to evaluate the vascular anatomy. CONTRAST:  100mL OMNIPAQUE IOHEXOL 350 MG/ML SOLN COMPARISON:  CT 04/29/2017 FINDINGS: Cardiovascular: Satisfactory opacification the pulmonary arteries to the segmental level. No pulmonary artery filling defects are identified. Central pulmonary arteries are normal caliber. Cardiomegaly with biatrial enlargement. Three-vessel coronary artery atherosclerosis. There is only trace pericardial fluid at this time.  Atherosclerotic plaque within the normal caliber aorta. Normal 3 vessel branching of the aortic arch with calcification of the proximal great vessels. Slight reflux of contrast into the IVC. No other major venous abnormality. Mediastinum/Nodes: No mediastinal fluid or gas. Normal thyroid gland and thoracic inlet. No acute abnormality of the trachea or esophagus. No worrisome mediastinal, hilar or axillary adenopathy. Lungs/Pleura: Limited assessment given respiratory motion artifact. Low lung volumes and atelectatic changes. No focal consolidative opacity, pneumothorax or effusion. No convincing CT features of edema. Few scattered calcified granulomata. No concerning pulmonary nodules or masses. Upper Abdomen: Some layering calcified gallstones or biliary sludge may be present within the gallbladder, incompletely visualized on this exam. 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 Musculoskeletal: Degenerative changes are present in the imaged spine and shoulders. No acute osseous abnormality or suspicious osseous lesion. No worrisome chest wall lesions. Review of the MIP images confirms the above findings. IMPRESSION: 1. No evidence of pulmonary embolism. 2. Trace pericardial effusion. Cardiomegaly with biatrial enlargement. Three-vessel coronary artery atherosclerosis. 3. Atelectatic changes and respiratory motion without other acute intrathoracic process. 4. Reflux of contrast into the IVC, can be seen with elevated right heart pressure/right heart dysfunction. 5. Cholelithiasis or biliary sludge may be present within the gallbladder, incompletely visualized on this exam. Correlate with exam findings and consider right upper quadrant ultrasound. 6. 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 7. Aortic Atherosclerosis (ICD10-I70.0). Electronically Signed   By: Kreg ShropshirePrice  DeHay M.D.   On: 02/05/2020 05:27   CARDIAC CATHETERIZATION  Result Date: 02/06/2020  LV end diastolic pressure  is mildly elevated.  There is no aortic valve stenosis.  1st Diag lesion is 60% stenosed.  Prox RCA lesion is 55% stenosed.  Mid LAD lesion is 45% stenosed.  Mid LAD to Dist LAD lesion is 40% stenosed.  SUMMARY  Moderate tenderness three-vessel disease:  50 to 60% focal proximal-mid RCA (in a band),  40 to 50% diffuse mid LAD with diffuse 50 to 60% 1st Diag.  Mildly elevated LVEDP with normal LVEF on Echo.  Suspect + Troponin due to Afib & Microvascular CAD. RECOMMENDATIONS  [facility]  Okay to restart IV heparin 8 hours sheath removal - Can start OAC by 8 hr after sheath removal.   Continue Aggressive Risk Factor Modification  Would consider potentially using diltiazem as calcium channel blocker for rate control and microvascular antianginal benefit. Bryan Lemmaavid Harding, MD  DG Chest Portable 1 View  Result Date: 02/04/2020 CLINICAL DATA:  Chest pain, shortness of breath EXAM: PORTABLE CHEST 1 VIEW COMPARISON:  12/19/2017 FINDINGS: Single frontal view of the chest demonstrates an enlarged cardiac silhouette. No acute airspace disease, effusion, or pneumothorax. No acute bony abnormalities. IMPRESSION: 1. Enlarged cardiac silhouette.  No acute airspace disease. Electronically Signed   By: Sharlet SalinaMichael  Brown M.D.   On: 02/04/2020 21:25   ECHOCARDIOGRAM COMPLETE  Result Date: 02/05/2020    ECHOCARDIOGRAM REPORT   Patient [facility]   [facility]  Montez Hageman. Date of Exam: 02/05/2020 Medical Rec #:  [id number]                Height:       69.0 in Accession #:    [id number]               Weight:       280.0 lb Date of Birth:  1955/03/14                BSA:          2.384 m Patient Age:    64 years                 BP:           151/96 mmHg Patient Gender: M                        HR:           72 bpm. Exam Location:  Inpatient Procedure: 2D Echo, Cardiac Doppler and Color Doppler Indications:    I31.3 Pericardial effusion (noninflammatory)  History:        Patient has  no prior history of Echocardiogram examinations.                 Abnormal ECG, Signs/Symptoms:Chest Pain; Risk                 Factors:Hypertension and Sleep Apnea.  Sonographer:    Sheralyn Boatman RDCS Referring Phys: [id number] Deno Lunger [facility]  Sonographer Comments: Technically difficult study due to poor echo windows and patient is morbidly obese. Image acquisition challenging due to patient body habitus. IMPRESSIONS  1. Left ventricular ejection fraction, by estimation, is 60 to 65%. The left ventricle has normal function. The left ventricle has no regional wall motion abnormalities. There is moderate left ventricular hypertrophy. Left ventricular diastolic function  could not be evaluated.  2. Right ventricular systolic function is normal. The right ventricular size is normal. Tricuspid regurgitation signal is inadequate for assessing PA pressure.  3. The mitral valve is abnormal. Mild mitral valve regurgitation.  4. The aortic valve is tricuspid. Aortic valve regurgitation is not visualized.  5. Aortic dilatation noted. There is mild dilatation of the ascending aorta, measuring 42 mm.  6. The inferior vena cava is normal in size with greater than 50% respiratory variability, suggesting right atrial pressure of 3 mmHg.  7. No significant pericardial effusion. RV apical fat pad noted. Comparison(s): No prior Echocardiogram. FINDINGS  Left Ventricle: Left ventricular ejection fraction, by estimation, is 60 to 65%. The left ventricle has normal function. The left ventricle has no regional wall motion abnormalities. The left ventricular internal cavity size was normal in size. There is  moderate left ventricular hypertrophy. Left ventricular diastolic function could not be evaluated due to atrial fibrillation. Left ventricular diastolic function could not be evaluated. Right Ventricle: The right ventricular size is normal. No increase in right ventricular wall thickness. Right ventricular systolic function is normal.  Tricuspid regurgitation signal is inadequate for assessing PA pressure. Left Atrium: Left atrial size was normal in size. Right Atrium: Right atrial size was normal in size. Pericardium: There is no evidence of pericardial effusion. The pericardial effusion is circumferential. Presence of pericardial fat pad. Mitral Valve: The mitral valve is abnormal. There is mild thickening of the mitral valve leaflet(s). Mild mitral valve regurgitation, with posteriorly-directed jet. Tricuspid Valve: The tricuspid valve is grossly normal. Tricuspid valve regurgitation is trivial.  Aortic Valve: The aortic valve is tricuspid. Aortic valve regurgitation is not visualized. Pulmonic Valve: The pulmonic valve was grossly normal. Pulmonic valve regurgitation is trivial. Aorta: Aortic dilatation noted. There is mild dilatation of the ascending aorta, measuring 42 mm. Venous: The inferior vena cava is normal in size with greater than 50% respiratory variability, suggesting right atrial pressure of 3 mmHg. IAS/Shunts: No atrial level shunt detected by color flow Doppler.  LEFT VENTRICLE PLAX 2D LVIDd:         4.80 cm LVIDs:         3.10 cm LV PW:         1.65 cm LV IVS:        1.45 cm LVOT diam:     2.20 cm LV SV:         51 LV SV Index:   22 LVOT Area:     3.80 cm  LV Volumes (MOD) LV vol d, MOD A2C: 110.0 ml LV vol d, MOD A4C: 113.0 ml LV vol s, MOD A2C: 46.7 ml LV vol s, MOD A4C: 52.2 ml LV SV MOD A2C:     63.3 ml LV SV MOD A4C:     113.0 ml LV SV MOD BP:      63.0 ml RIGHT VENTRICLE             IVC RV S prime:     12.50 cm/s  IVC diam: 1.30 cm TAPSE (M-mode): 1.6 cm LEFT ATRIUM             Index       RIGHT ATRIUM           Index LA diam:        4.80 cm 2.01 cm/m  RA Area:     16.00 cm LA Vol (A2C):   63.2 ml 26.51 ml/m RA Volume:   43.20 ml  18.12 ml/m LA Vol (A4C):   76.1 ml 31.93 ml/m LA Biplane Vol: 72.6 ml 30.46 ml/m  AORTIC VALVE             PULMONIC VALVE LVOT Vmax:   75.40 cm/s  PR End Diast Vel: 1.92 msec LVOT Vmean:   55.500 cm/s LVOT VTI:    0.135 m  AORTA Ao Root diam: 3.60 cm Ao Asc diam:  4.00 cm MITRAL VALVE MV Area (PHT): 6.17 cm      SHUNTS MV Decel Time: 123 msec      Systemic VTI:  0.14 m MR Peak grad:    124.5 mmHg  Systemic Diam: 2.20 cm MR Mean grad:    90.0 mmHg MR Vmax:         558.00 cm/s MR Vmean:        454.0 cm/s MR PISA:         2.26 cm MR PISA Eff ROA: 14 mm MR PISA Radius:  0.60 cm MV E velocity: 126.00 cm/s Marcus Shutter MD Electronically signed by Marcus Shutter MD Signature Date/Time: 02/05/2020/11:11:32 AM    Final       Subjective: Pt seen & examined-NO CP, shortness of breath. His headache improved & wishes to go home.  Discharge Exam: Vitals:   02/07/20 1149 02/07/20 1455  BP: (!) 139/98 (!) 137/95  Pulse: 94 85  Resp:  18  Temp:  97.9 F (36.6 C)  SpO2:  97%   Vitals:   02/07/20 0411 02/07/20 1147 02/07/20 1149 02/07/20 1455  BP: (!) 163/103 (!) 155/100 (!) 139/98 (!) 137/95  Pulse: Marland Kitchen)  108 89 94 85  Resp: 18 20  18   Temp: (!) 97.5 F (36.4 C) 97.8 F (36.6 C)  97.9 F (36.6 C)  TempSrc: Oral Oral  Oral  SpO2: 96% 96%  97%  Weight:      Height:        General: Pt is alert, awake, not in acute distress Cardiovascular: RRR, S1/S2 +, no rubs, no gallops Respiratory: CTA bilaterally, no wheezing, no rhonchi Abdominal: Soft, NT, ND, bowel sounds + Extremities: no edema, no cyanosis    The results of significant diagnostics from this hospitalization (including imaging, microbiology, ancillary and laboratory) are listed below for reference.     Microbiology: Recent Results (from the past 240 hour(s))  Resp Panel by RT-PCR (Flu A&B, Covid) Nasopharyngeal Swab     Status: None   Collection Time: 02/04/20 10:57 PM   Specimen: Nasopharyngeal Swab; Nasopharyngeal(NP) swabs in vial transport medium  Result Value Ref Range Status   SARS Coronavirus 2 by RT PCR NEGATIVE NEGATIVE Final    Comment: (NOTE) SARS-CoV-2 target nucleic acids are NOT DETECTED.  The  SARS-CoV-2 RNA is generally detectable in upper respiratory specimens during the acute phase of infection. The lowest concentration of SARS-CoV-2 viral copies this assay can detect is 138 copies/mL. A negative result does not preclude SARS-Cov-2 infection and should not be used as the sole basis for treatment or other patient management decisions. A negative result may occur with  improper specimen collection/handling, submission of specimen other than nasopharyngeal swab, presence of viral mutation(s) within the areas targeted by this assay, and inadequate number of viral copies(<138 copies/mL). A negative result must be combined with clinical observations, patient history, and epidemiological information. The expected result is Negative.  Fact Sheet for Patients:  02/06/20  Fact Sheet for Healthcare Providers:  BloggerCourse.com  This test is no t yet approved or cleared by the [place] FDA and  has been authorized for detection and/or diagnosis of SARS-CoV-2 by FDA under an Emergency Use Authorization (EUA). This EUA will remain  in effect (meaning this test can be used) for the duration of the COVID-19 declaration under Section 564(b)(1) of the Act, 21 U.S.C.section 360bbb-3(b)(1), unless the authorization is terminated  or revoked sooner.       Influenza A by PCR NEGATIVE NEGATIVE Final   Influenza B by PCR NEGATIVE NEGATIVE Final    Comment: (NOTE) The Xpert Xpress SARS-CoV-2/FLU/RSV plus assay is intended as an aid in the diagnosis of influenza from Nasopharyngeal swab specimens and should not be used as a sole basis for treatment. Nasal washings and aspirates are unacceptable for Xpert Xpress SARS-CoV-2/FLU/RSV testing.  Fact Sheet for Patients: [place]  Fact Sheet for Healthcare Providers: BloggerCourse.com  This test is not yet approved or  cleared by the [employer] FDA and has been authorized for detection and/or diagnosis of SARS-CoV-2 by FDA under an Emergency Use Authorization (EUA). This EUA will remain in effect (meaning this test can be used) for the duration of the COVID-19 declaration under Section 564(b)(1) of the Act, 21 U.S.C. section 360bbb-3(b)(1), unless the authorization is terminated or revoked.  Performed at [facility], [address] [place], [place]      Labs: BNP (last 3 results) Recent Labs    02/05/20 0450  BNP 218.8*   Basic Metabolic Panel: Recent Labs  Lab 02/04/20 1956 02/05/20 0449 02/06/20 0148 02/07/20 0338  NA 137 138 133* 135  K 4.2 3.9 3.6 3.8  CL 103  102 101 103  CO2 25 24 20* 22  GLUCOSE 122* 114* 145* 147*  BUN CREATININE 0.85 0.78 0.81 0.90  CALCIUM 9.1 8.9 8.8* 8.7*  MG  --  2.1 2.1 2.0   Liver Function Tests: Recent Labs  Lab 02/05/20 0449  AST 22  ALT 25  ALKPHOS 90  BILITOT 0.8  PROT 6.7  ALBUMIN 3.6   No results for input(s): LIPASE, AMYLASE in the last 168 hours. No results for input(s): AMMONIA in the last 168 hours. CBC: Recent Labs  Lab 02/04/20 1956 02/05/20 0449 02/06/20 0148 02/07/20 0338  WBC 12.2* 10.6* 10.5 8.5  HGB 17.0 16.7 17.6* 17.0  HCT 50.4 49.1 51.1 47.8  MCV 96.2 95.2 94.3 91.4  PLT 185 177 211 211   Cardiac Enzymes: No results for input(s): CKTOTAL, CKMB, CKMBINDEX, TROPONINI in the last 168 hours. BNP: Invalid input(s): POCBNP CBG: No results for input(s): GLUCAP in the last 168 hours. D-Dimer No results for input(s): DDIMER in the last 72 hours. Hgb A1c No results for input(s): HGBA1C in the last 72 hours. Lipid Profile Recent Labs    02/05/20 0450  CHOL 131  HDL 28*  LDLCALC 89  TRIG 69  CHOLHDL 4.7   Thyroid function studies Recent Labs    02/05/20 0532  TSH 4.970*   Anemia work up No results for input(s): VITAMINB12, FOLATE, FERRITIN, TIBC, IRON,  RETICCTPCT in the last 72 hours. Urinalysis No results found for: COLORURINE, APPEARANCEUR, LABSPEC, PHURINE, GLUCOSEU, HGBUR, BILIRUBINUR, KETONESUR, PROTEINUR, UROBILINOGEN, NITRITE, LEUKOCYTESUR Sepsis Labs Invalid input(s): PROCALCITONIN,  WBC,  LACTICIDVEN Microbiology Recent Results (from the past 240 hour(s))  Resp Panel by RT-PCR (Flu A&B, Covid) Nasopharyngeal Swab     Status: None   Collection Time: 02/04/20 10:57 PM   Specimen: Nasopharyngeal Swab; Nasopharyngeal(NP) swabs in vial transport medium  Result Value Ref Range Status   SARS Coronavirus 2 by RT PCR NEGATIVE NEGATIVE Final    Comment: (NOTE) SARS-CoV-2 target nucleic acids are NOT DETECTED.  The SARS-CoV-2 RNA is generally detectable in upper respiratory specimens during the acute phase of infection. The lowest concentration of SARS-CoV-2 viral copies this assay can detect is 138 copies/mL. A negative result does not preclude SARS-Cov-2 infection and should not be used as the sole basis for treatment or other patient management decisions. A negative result may occur with  improper specimen collection/handling, submission of specimen other than nasopharyngeal swab, presence of viral mutation(s) within the areas targeted by this assay, and inadequate number of viral copies(<138 copies/mL). A negative result must be combined with clinical observations, patient history, and epidemiological information. The expected result is Negative.  Fact Sheet for Patients:  BloggerCourse.com  Fact Sheet for Healthcare Providers:  [employer]iousBroker.it  This test is no t yet approved or cleared by the [place] FDA and  has been authorized for detection and/or diagnosis of SARS-CoV-2 by FDA under an Emergency Use Authorization (EUA). This EUA will remain  in effect (meaning this test can be used) for the duration of the COVID-19 declaration under Section 564(b)(1) of the Act,  21 U.S.C.section 360bbb-3(b)(1), unless the authorization is terminated  or revoked sooner.       Influenza A by PCR NEGATIVE NEGATIVE Final   Influenza B by PCR NEGATIVE NEGATIVE Final    Comment: (NOTE) The Xpert Xpress SARS-CoV-2/FLU/RSV plus assay is intended as an aid in the diagnosis of influenza from Nasopharyngeal swab specimens and should not be used as a sole  basis for treatment. Nasal washings and aspirates are unacceptable for Xpert Xpress SARS-CoV-2/FLU/RSV testing.  Fact Sheet for Patients: BloggerCourse.com  Fact Sheet for Healthcare Providers: [employer]iousBroker.it  This test is not yet approved or cleared by the [place] FDA and has been authorized for detection and/or diagnosis of SARS-CoV-2 by FDA under an Emergency Use Authorization (EUA). This EUA will remain in effect (meaning this test can be used) for the duration of the COVID-19 declaration under Section 564(b)(1) of the Act, 21 U.S.C. section 360bbb-3(b)(1), unless the authorization is terminated or revoked.  Performed at [facility] Children, [address] [place], [place] [address]      Time coordinating discharge: Over 30 minutes  SIGNED:   Ollen Bowl, MD  [facility] 02/07/2020, 5:02 PM Pager   If 7PM-7AM, please contact night-coverage [email or web address]

## 2020-04-23 ENCOUNTER — Other Ambulatory Visit: Payer: Self-pay | Admitting: Internal Medicine

## 2020-12-28 ENCOUNTER — Other Ambulatory Visit: Payer: Self-pay | Admitting: Family Medicine

## 2021-02-19 IMAGING — CT CT ANGIO CHEST
2 of 6 series · 18 of 36 positions shown · IV contrast (OMNIPAQUE 350)
Comparison: CT 04/29/2017

CLINICAL DATA: Chest pain radiating to the neck and back, states
that this is similar to prior episode of pericarditis, negative
[id number], elevated troponin

EXAM:
CT ANGIOGRAPHY CHEST WITH CONTRAST
TECHNIQUE: Multidetector CT imaging of the chest was performed using the
standard protocol during bolus administration of intravenous
contrast. Multiplanar CT image reconstructions and MIPs were
obtained to evaluate the vascular anatomy.
CONTRAST:  100mL OMNIPAQUE IOHEXOL 350 MG/ML SOLN

[Series 5: thins · axial · 0.78mm/px · z∈[+1475,+1713]mm · 17 of 268 slices shown]
[im 15/268  lung]
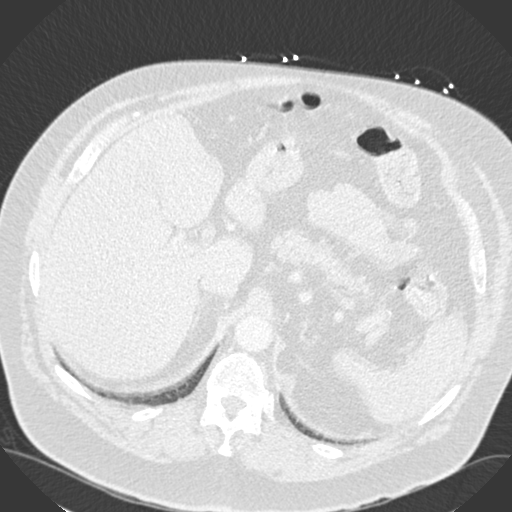
[im 30/268  mediastinal]
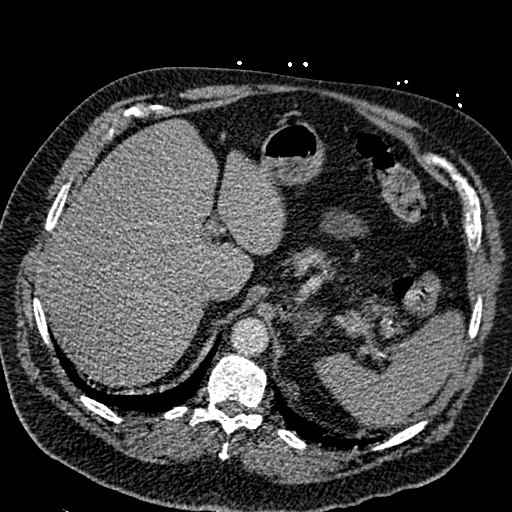
[im 45/268  lung]
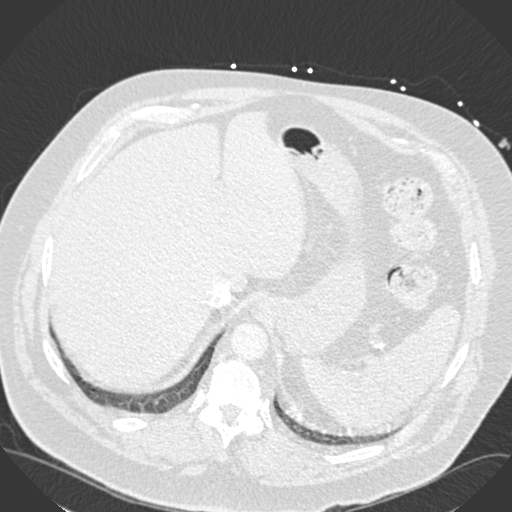
[im 60/268  mediastinal]
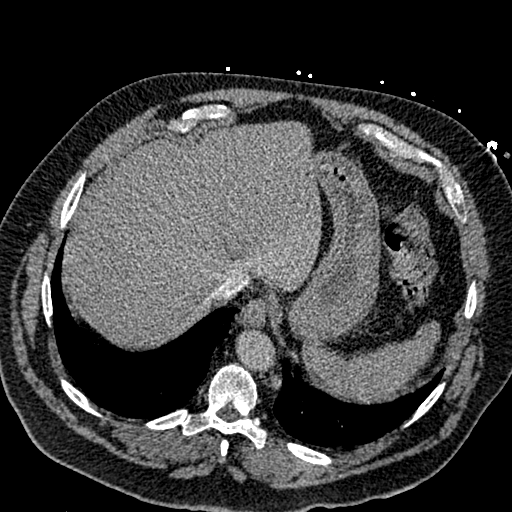
[im 75/268  lung]
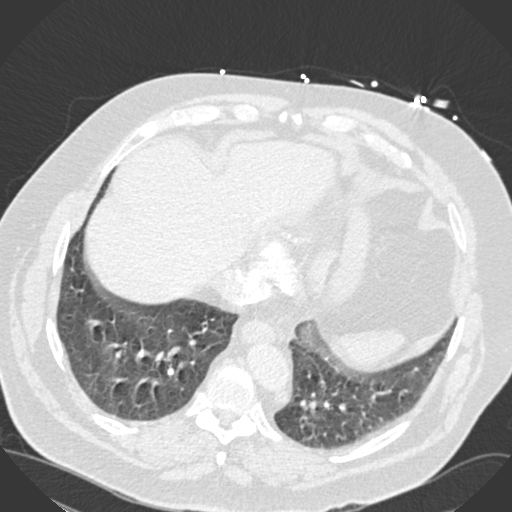
[im 90/268  mediastinal]
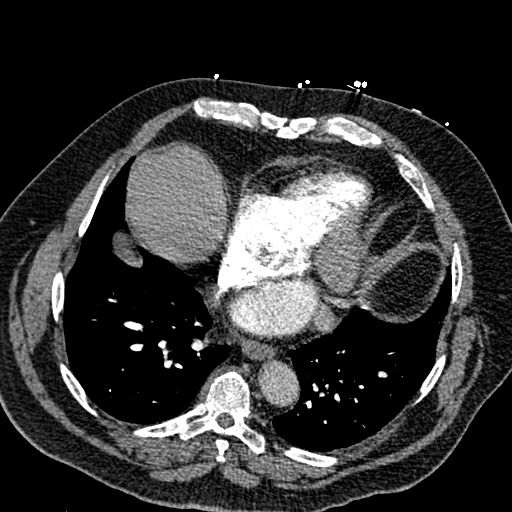
[im 104/268  lung]
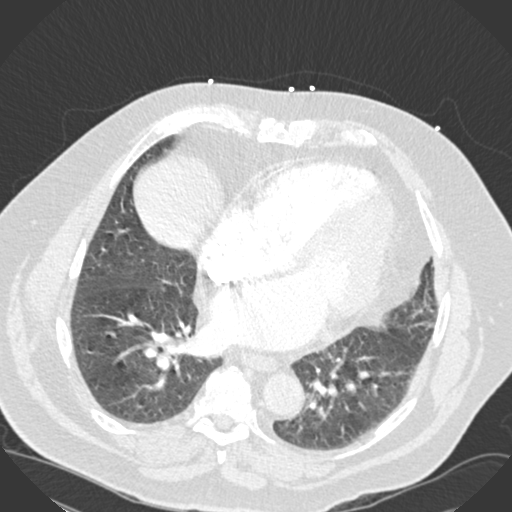
[im 119/268  mediastinal]
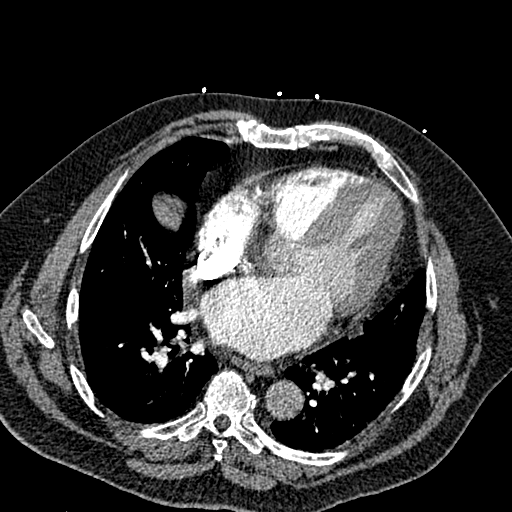
[im 134/268  lung]
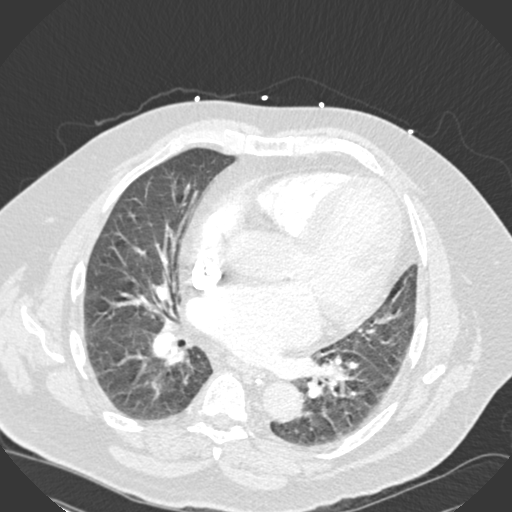
[im 149/268  mediastinal]
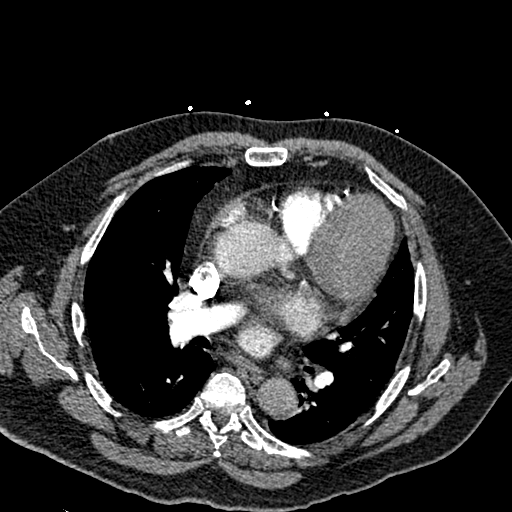
[im 164/268  lung]
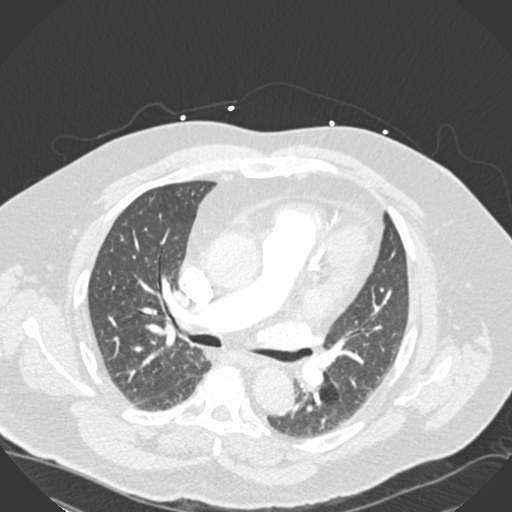
[im 179/268  mediastinal]
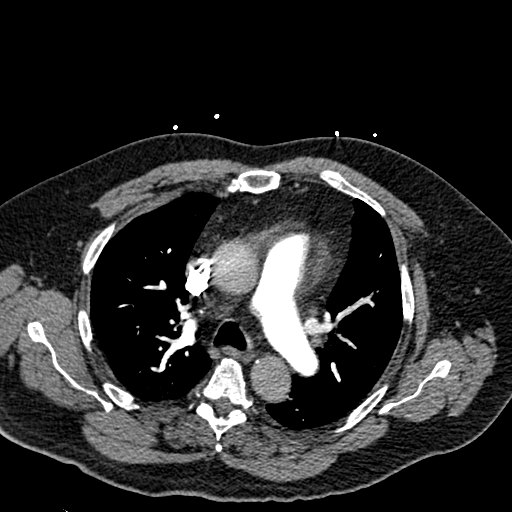
[im 193/268  lung]
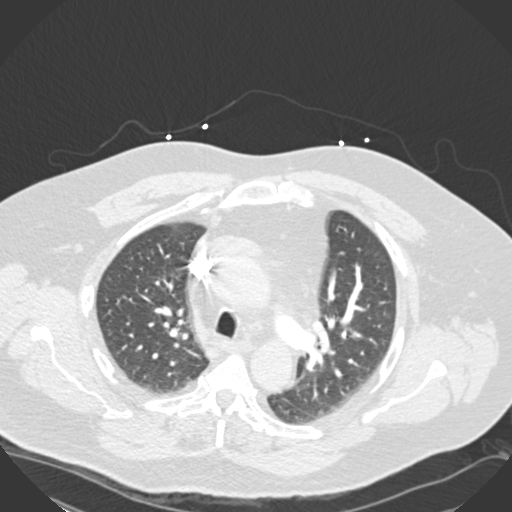
[im 208/268  mediastinal]
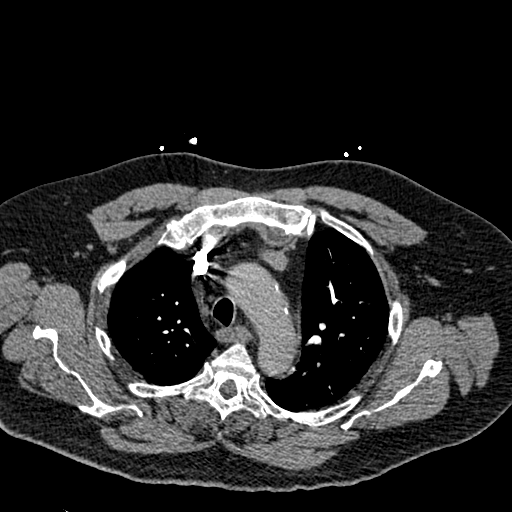
[im 223/268  lung]
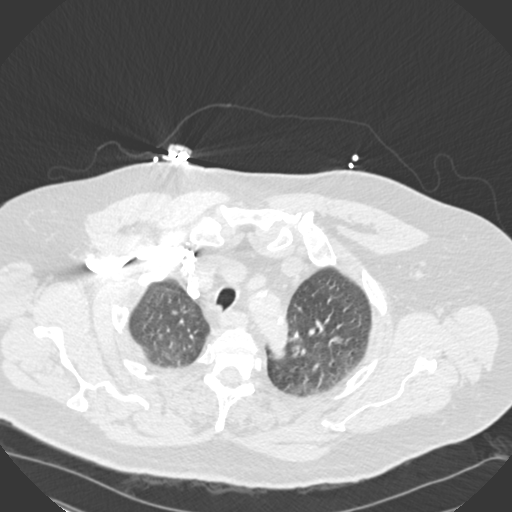
[im 238/268  mediastinal]
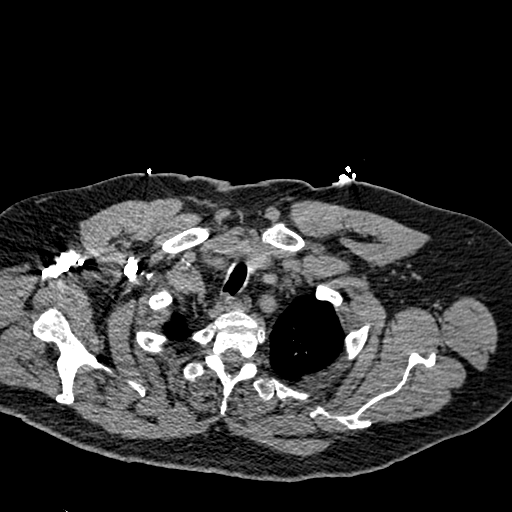
[im 253/268  lung]
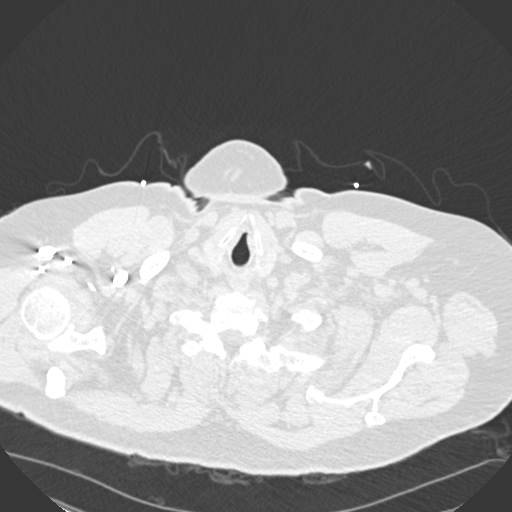

[Series 7: coronal mpr · coronal · 0.54mm/px · 1 of 162 slices shown]
[im 81/162  mediastinal]
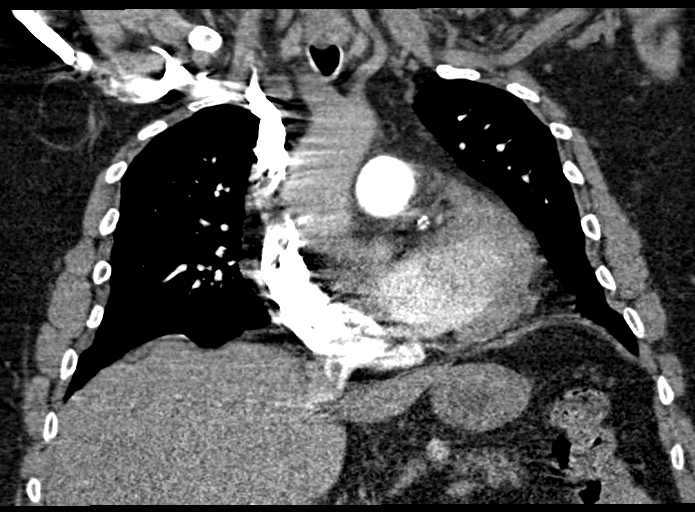

[18 of 36 positions shown; findings below may reference images not displayed]

FINDINGS: Cardiovascular: Satisfactory opacification the pulmonary arteries to
the segmental level. No pulmonary artery filling defects are
identified. Central pulmonary arteries are normal caliber.
Cardiomegaly with biatrial enlargement. Three-vessel coronary artery
atherosclerosis. There is only trace pericardial fluid at this time.
Atherosclerotic plaque within the normal caliber aorta. Normal 3
vessel branching of the aortic arch with calcification of the
proximal great vessels. Slight reflux of contrast into the IVC. No
other major venous abnormality.

Mediastinum/Nodes: No mediastinal fluid or gas. Normal thyroid gland
and thoracic inlet. No acute abnormality of the trachea or
esophagus. No worrisome mediastinal, hilar or axillary adenopathy.

Lungs/Pleura: Limited assessment given respiratory motion artifact.
Low lung volumes and atelectatic changes. No focal consolidative
opacity, pneumothorax or effusion. No convincing CT features of
edema. Few scattered calcified granulomata. No concerning pulmonary
nodules or masses.

Upper Abdomen: Some layering calcified gallstones or biliary sludge
may be present within the gallbladder, incompletely visualized on
this exam. Stable small splenic artery aneurysm measuring 12 mm.

Musculoskeletal: Degenerative changes are present in the imaged
spine and shoulders. No acute osseous abnormality or suspicious
osseous lesion. No worrisome chest wall lesions.

Review of the MIP images confirms the above findings.
IMPRESSION: 1. No evidence of pulmonary embolism.
2. Trace pericardial effusion. Cardiomegaly with biatrial
enlargement. Three-vessel coronary artery atherosclerosis.
3. Atelectatic changes and respiratory motion without other acute
intrathoracic process.
4. Reflux of contrast into the IVC, can be seen with elevated right
heart pressure/right heart dysfunction.
5. Cholelithiasis or biliary sludge may be present within the
gallbladder, incompletely visualized on this exam. Correlate with
exam findings and consider right upper quadrant ultrasound.
6. Stable small calcified splenic artery aneurysm measuring 12 mm.
7. Aortic Atherosclerosis ([id number]).
